# Patient Record
Sex: Female | Born: 1965 | Race: White | Hispanic: No | Marital: Married | Smoking: Never smoker
Health system: Southern US, Community
[De-identification: ages and names within clinical notes are randomized; demographics above are authoritative.]

## PROBLEM LIST (undated history)

## (undated) DIAGNOSIS — I341 Nonrheumatic mitral (valve) prolapse: Secondary | ICD-10-CM

## (undated) DIAGNOSIS — I493 Ventricular premature depolarization: Secondary | ICD-10-CM

## (undated) DIAGNOSIS — I472 Ventricular tachycardia: Secondary | ICD-10-CM

## (undated) DIAGNOSIS — I480 Paroxysmal atrial fibrillation: Secondary | ICD-10-CM

## (undated) DIAGNOSIS — I4729 Other ventricular tachycardia: Secondary | ICD-10-CM

## (undated) DIAGNOSIS — E039 Hypothyroidism, unspecified: Secondary | ICD-10-CM

## (undated) DIAGNOSIS — I491 Atrial premature depolarization: Secondary | ICD-10-CM

## (undated) DIAGNOSIS — I7300 Raynaud's syndrome without gangrene: Secondary | ICD-10-CM

## (undated) DIAGNOSIS — E785 Hyperlipidemia, unspecified: Secondary | ICD-10-CM

## (undated) DIAGNOSIS — Z90710 Acquired absence of both cervix and uterus: Secondary | ICD-10-CM

## (undated) DIAGNOSIS — I471 Supraventricular tachycardia: Secondary | ICD-10-CM

## (undated) DIAGNOSIS — I340 Nonrheumatic mitral (valve) insufficiency: Secondary | ICD-10-CM

## (undated) DIAGNOSIS — N393 Stress incontinence (female) (male): Secondary | ICD-10-CM

## (undated) DIAGNOSIS — I4719 Other supraventricular tachycardia: Secondary | ICD-10-CM

## (undated) HISTORY — DX: Hyperlipidemia, unspecified: E78.5

## (undated) HISTORY — DX: Stress incontinence (female) (male): N39.3

## (undated) HISTORY — DX: Paroxysmal atrial fibrillation: I48.0

## (undated) HISTORY — DX: Atrial premature depolarization: I49.1

## (undated) HISTORY — DX: Ventricular tachycardia: I47.2

## (undated) HISTORY — PX: BREAST ENHANCEMENT SURGERY: SHX7

## (undated) HISTORY — DX: Ventricular premature depolarization: I49.3

## (undated) HISTORY — DX: Nonrheumatic mitral (valve) prolapse: I34.1

## (undated) HISTORY — DX: Supraventricular tachycardia: I47.1

## (undated) HISTORY — DX: Other supraventricular tachycardia: I47.19

## (undated) HISTORY — DX: Nonrheumatic mitral (valve) insufficiency: I34.0

## (undated) HISTORY — DX: Other ventricular tachycardia: I47.29

## (undated) HISTORY — DX: Raynaud's syndrome without gangrene: I73.00

## (undated) HISTORY — PX: INCONTINENCE SURGERY: SHX676

## (undated) HISTORY — DX: Acquired absence of both cervix and uterus: Z90.710

## (undated) HISTORY — PX: APPENDECTOMY: SHX54

## (undated) HISTORY — DX: Hypothyroidism, unspecified: E03.9

## (undated) HISTORY — PX: ABDOMINAL EXPLORATION SURGERY: SHX538

---

## 2002-01-28 ENCOUNTER — Other Ambulatory Visit: Admission: RE | Admit: 2002-01-28 | Discharge: 2002-01-28 | Payer: Self-pay | Admitting: Obstetrics and Gynecology

## 2003-02-08 ENCOUNTER — Other Ambulatory Visit: Admission: RE | Admit: 2003-02-08 | Discharge: 2003-02-08 | Payer: Self-pay | Admitting: Obstetrics and Gynecology

## 2004-02-10 ENCOUNTER — Other Ambulatory Visit: Admission: RE | Admit: 2004-02-10 | Discharge: 2004-02-10 | Payer: Self-pay | Admitting: Obstetrics and Gynecology

## 2005-02-12 ENCOUNTER — Other Ambulatory Visit: Admission: RE | Admit: 2005-02-12 | Discharge: 2005-02-12 | Payer: Self-pay | Admitting: Obstetrics and Gynecology

## 2006-01-08 ENCOUNTER — Encounter (INDEPENDENT_AMBULATORY_CARE_PROVIDER_SITE_OTHER): Payer: Self-pay | Admitting: *Deleted

## 2006-01-08 ENCOUNTER — Ambulatory Visit: Payer: Self-pay | Admitting: Cardiovascular Disease

## 2006-01-09 ENCOUNTER — Inpatient Hospital Stay (HOSPITAL_COMMUNITY): Admission: RE | Admit: 2006-01-09 | Discharge: 2006-01-10 | Payer: Self-pay | Admitting: Obstetrics and Gynecology

## 2006-01-09 ENCOUNTER — Encounter: Payer: Self-pay | Admitting: Internal Medicine

## 2006-01-30 ENCOUNTER — Ambulatory Visit: Payer: Self-pay | Admitting: Cardiovascular Disease

## 2006-02-15 ENCOUNTER — Ambulatory Visit: Payer: Self-pay | Admitting: Internal Medicine

## 2006-03-07 ENCOUNTER — Ambulatory Visit: Payer: Self-pay | Admitting: Cardiovascular Disease

## 2006-03-07 ENCOUNTER — Ambulatory Visit (HOSPITAL_COMMUNITY): Admission: RE | Admit: 2006-03-07 | Discharge: 2006-03-07 | Payer: Self-pay | Admitting: Cardiovascular Disease

## 2006-03-11 ENCOUNTER — Ambulatory Visit: Payer: Self-pay | Admitting: Internal Medicine

## 2006-05-13 ENCOUNTER — Ambulatory Visit: Payer: Self-pay | Admitting: Internal Medicine

## 2007-01-29 ENCOUNTER — Ambulatory Visit: Payer: Self-pay | Admitting: Internal Medicine

## 2008-07-23 ENCOUNTER — Ambulatory Visit: Payer: Self-pay | Admitting: Internal Medicine

## 2008-12-28 ENCOUNTER — Telehealth: Payer: Self-pay | Admitting: Internal Medicine

## 2009-12-02 ENCOUNTER — Telehealth: Payer: Self-pay | Admitting: Internal Medicine

## 2010-08-01 ENCOUNTER — Encounter: Payer: Self-pay | Admitting: Internal Medicine

## 2010-08-01 ENCOUNTER — Ambulatory Visit: Payer: Self-pay | Admitting: Internal Medicine

## 2010-08-01 DIAGNOSIS — R002 Palpitations: Secondary | ICD-10-CM

## 2010-08-03 ENCOUNTER — Telehealth: Payer: Self-pay | Admitting: Internal Medicine

## 2010-09-19 NOTE — Progress Notes (Signed)
Summary: pt needs refill today  Phone Note Refill Request Message from:  Patient on [facility] in [place] [phone]  Refills Requested: Medication #1:  INDERAL LA 80 MG XR24H-CAP Initial call taken by: Omer Jack,  December 02, 2009 8:34 AM    New/Updated Medications: INDERAL LA 80 MG XR24H-CAP (PROPRANOLOL HCL) take one tablet daily Prescriptions: INDERAL LA 80 MG XR24H-CAP (PROPRANOLOL HCL) take one tablet daily  #30 x 6   Entered by:   Judithe Modest CMA   Authorized by:   Nathen May, MD, [facility]   Signed by:   Judithe Modest CMA on 12/02/2009   Method used:   Electronically to        [facility] (retail)       [address]       [place], [place]  [address]       Ph: [phone]       Fax: [phone]   RxID:   [id number]

## 2010-09-21 NOTE — Assessment & Plan Note (Signed)
Summary: 2year f/u /sl   Visit Type:  2 year follow up   History of Present Illness:  Mrs. Schwier comes in for followup for ventricular or atrial ectopy, now well controlled on her Inderal LA 80.  She has, however, not being able to exercise or chose not exercise because it is associated with palpitations.   Her biggest issue now is pain in her back  Problems Prior to Update: 1)  Hyperlipidemia  (ICD-272.4) 2)  Dm  (ICD-250.00)  Current Medications (verified): 1)  Inderal La 80 Mg Xr24h-Cap (Propranolol Hcl) .... Take One Tablet Daily 2)  Protonix 40 Mg Solr (Pantoprazole Sodium) .... Two Times A Day 3)  Estrace 1 Mg Tabs (Estradiol) .... Once Daily 4)  Levothroid 50 Mcg Tabs (Levothyroxine Sodium) .... Once Daily 5)  Dhea 15-50 Mg Caps (Nutritional Supplements) .... Once Daily 6)  First-Testosterone Mc 2 % Crea (Testosterone Propionate) .... Vagenal Cream, 1/4 Gm Once Daily 7)  Prometrium 200 Mg Caps (Progesterone Micronized) .... At Bedtime 8)  Melatonin 3 Mg Tabs (Melatonin) .... At Bedtime  Allergies (verified): No Known Drug Allergies  Past History:  Past Medical History: Last updated: 07/31/2010  She has no history of diabetes, hypertension,  hyperlipidemia, family history of coronary artery disease, or tobacco use.  She gets a regular medical check-up that includes a lipid profile on a  yearly basis.  She has a history of endometriosis.  She has a history of  stress incontinence.  Past Surgical History: Last updated: 07/31/2010  She had a total abdominal hysterectomy as well as a  bladder sling today.  She has a history of abdominal laparoscopic surgery as  well as appendectomy and breast augmentation.  Family History: Last updated: 07/31/2010  Her mother is alive at age 88 with a history of paroxysmal  atrial fibrillation, but no history of coronary artery disease.  Her father  is alive at age 67 with no history of heart disease and she has no heart  disease in any of her siblings.  Social History: Last updated: 07/31/2010 She lives in [place] with her husband and works in  Aeronautical engineer.  She has no history of alcohol, tobacco, or drug abuse.  Vital Signs:  Patient profile:   45 year old female Height:      69 inches Weight:      181.25 pounds BMI:     26.86 Pulse rate:   73 / minute BP sitting:   118 / 76  (left arm) Cuff size:   regular  Vitals Entered By: Caralee Ates CMA (August 01, 2010 2:51 PM)  Physical Exam  General:  The patient was alert and oriented in no acute distress. HEENT Normal.  Neck veins were flat, carotids were brisk.  Lungs were clear.  Heart sounds were regular without murmurs or gallops.  Abdomen was soft with active bowel sounds. There is no clubbing cyanosis or edema. Skin Warm and dry    EKG  Procedure date:  08/01/2010  Findings:      sinus rhythm at 62 Oh 0.07/2104.40 Axis of 45  Impression & Recommendations:  Problem # 1:  PALPITATIONS, OCCASIONAL (ICD-785.1) Her palpitations are now largely quiet. Will try and wean her off of her Inderal. We will have her current capsules in hal for one month and if without symptoms to stop it altogether. She'll let us know. The following medications were removed from the medication list:    Inderal La 80 Mg Xr24h-cap (Propranolol hcl) Her  updated medication list for this problem includes:    Inderal La 80 Mg Xr24h-cap (Propranolol hcl) .Marland Kitchen... Take one tablet daily  Appended Document: 2year f/u /sl    Clinical Lists Changes  Observations: Added new observation of PI CARDIO: Your physician recommends that you schedule a follow-up appointment in: as needed Your physician recommends that you continue on your current medications as directed. Please refer to the Current Medication list given to you today. (08/01/2010 15:26)       Patient Instructions: 1)  Your physician recommends that you schedule a follow-up appointment in: as  needed 2)  Your physician recommends that you continue on your current medications as directed. Please refer to the Current Medication list given to you today.

## 2010-09-21 NOTE — Progress Notes (Signed)
Summary: med questions  Phone Note Call from Patient   Caller: Patient [phone] Reason for Call: Talk to Nurse Summary of Call: pt has questions re  propanalol Initial call taken by: Glynda Jaeger,  August 03, 2010 3:08 PM  Follow-up for Phone Call        pt wanted to have 40mg  in 20mg  pills and take two times a day rather than separating capsules. Dr. Juanda Chance ok the change.  Follow-up by: Claris Gladden RN,  August 03, 2010 5:46 PM    New/Updated Medications: PROPRANOLOL HCL 20 MG TABS (PROPRANOLOL HCL) two times a day Prescriptions: PROPRANOLOL HCL 20 MG TABS (PROPRANOLOL HCL) two times a day  #180 x 3   Entered by:   Claris Gladden RN   Authorized by:   Nathen May, MD, [facility]   Signed by:   Claris Gladden RN on 08/03/2010   Method used:   Electronically to        [facility] (retail)       [address]       [place], [place]  [address]       Ph: [phone]       Fax: [phone]   RxID:   [id number]

## 2011-01-02 NOTE — Assessment & Plan Note (Signed)
[facility]                         ELECTROPHYSIOLOGY OFFICE NOTE   NAME:[name], Caroline BIGLER                     MRN:          [id number]  DATE:07/23/2008                            DOB:          1965-10-22    [name] comes in for followup for ventricular or atrial ectopy,  now well controlled on her Inderal LA 80.  She has, however, not being  able to exercise or chose not exercise because it is associated with  palpitations.   On examination, her weight is up about 13 pounds over the last 2 years  and is up 7 pounds over the last year and a half.  The lungs were clear.  The heart sounds were regular.  Extremities were without edema.   IMPRESSION:  1. Symptomatic atrial or ventricular ectopy.  2. Beta-blocker for number 1.   I have given her prescription for Inderal 20 to try to take in the  morning prior to exercise to see if the short-term effects does not  suffice to allow her to exercise.   We will see her again in 2 years' time.     Duke Salvia, MD, [facility]  Electronically Signed    SCK/MedQ  DD: 07/23/2008  DT: 07/24/2008  Job #: [id number]   cc:   Brooke Bonito

## 2011-01-02 NOTE — Assessment & Plan Note (Signed)
 HEALTHCARE                         ELECTROPHYSIOLOGY OFFICE NOTE   NAME:[name], Caroline GADEA                     MRN:          [id number]  DATE:01/29/2007                            DOB:          [birth year]    [name] comes in.  She had symptomatic atrial ventricular ectopy  which is now well controlled on Inderal LA 80.  She was initially  concerned about weight gain associated with this.  This concern has  persisted.  Her weight is up about 16 pounds over the last year and a  half but about 4 pounds only in the last nine months on the drug.  She  feels much better.  Occasionally at the end of the day, she will have  breakthrough, and she will take her evening dose a little bit early.   PHYSICAL EXAMINATION:  VITAL SIGNS:  Her blood pressure today was  110/78.  Her pulse was 65.  LUNGS:  Clear.  CARDIAC:  Heart sounds were regular.   IMPRESSION:  1. Atrial and ventricular ectopy.  2. Beta blocker therapy, question associated with weight gain.   Overall, [name] is doing well.  She feels comfortable on her  current medical regimen.  She would like to follow [place] up on a p.r.n.  basis.  She will follow up with Dr. Brynda Rim, and refills will be  prescribed by him.  I will be glad to be available when needed.     Duke Salvia, MD, [facility]  Electronically Signed    SCK/MedQ  DD: 01/29/2007  DT: 01/30/2007  Job #: [id number]   cc:   Brooke Bonito

## 2011-01-05 NOTE — Op Note (Signed)
[name], [name] NO.:  [id number]   MEDICAL RECORD NO.:  [id number]          PATIENT TYPE:  AMB   LOCATION:  DAY                          FACILITY:  [facility]   PHYSICIAN:  Huel Cote, M.D. DATE OF BIRTH:  1965/11/07   DATE OF PROCEDURE:  01/08/2006  DATE OF DISCHARGE:                                 OPERATIVE REPORT   PREOPERATIVE DIAGNOSIS:  1.  Menorrhagia.  2.  Dysmenorrhea.  3.  Probable endometriosis.  4.  Stress urinary incontinence.   POSTOPERATIVE DIAGNOSIS:  1.  Menorrhagia.  2.  Dysmenorrhea.  3.  Probable endometriosis.  4.  Stress urinary incontinence.  5.  Foci of endometriosis noted throughout pelvis.   PROCEDURE:  Attempted laparoscopic assisted vaginal hysterectomy, total  abdominal hysterectomy, anterior posterior repair, and a sling, and  cystoscopy by Dr. Sherron Monday.   SURGEON:  Huel Cote, M.D.   ASSISTANT:  Zenaida Niece, M.D.  Martina Sinner, M.D.   ANESTHESIA:  General.   SPECIMENS:  Uterus and cervix were sent.   ESTIMATED BLOOD LOSS:  450 mL.   URINE OUTPUT:  100 mL clear urine.   IV FLUIDS:  2400 mL.   COMPLICATIONS:  There was some bleeding from the left infundibulopelvic  ligament which could not be well controlled through the scope despite  multiple efforts. Therefore, the patient was opened and an open approach was  utilized to control bleeding.   FINDINGS:  The ovaries and tubes were normal.  There were several foci of  endometriosis noted throughout the pelvis especially on the left lower  sidewall which was treated with cauterization.   DESCRIPTION OF PROCEDURE:  The patient was taken to the operating room where  general anesthesia was obtained without difficulty.  She was then prepped  and draped in a normal sterile fashion in the dorsal lithotomy position.  Attention was then turned to the patient's abdomen.  An infraumbilical  incision was made with the scalpel and carried through to  the underlying  layer with the scalpel.  This was then elevated and the Veress needle easily  introduced into the abdominal cavity.  Pneumoperitoneum was then obtained  with approximately 3 liters CO2 gas.  The Veress needle was then removed and  the 10/11 was trocar was introduced under direct visualization with the  camera and entered the peritoneal cavity easily.  At this point, the abdomen  and pelvis were inspected.  It was noted that the bowel was quite redundant  and was difficult to be pulled up out of the pelvis. Two additional port  sites were then placed with 5 mm trocars in each lower quadrant under direct  visualization. Each trocar site was injected with 0.25% Marcaine prior to  placement. At this point, the uterus was found to be quite retroverted and  had good mobility. It was elevated with a grasper on the left cornua. The  ovaries and tubes were inspected and found to be normal.  Therefore, the  decision to leave those in situ was made.  A Harmonic scalpel was then  utilized to transect the utero-ovarian ligament  as well as the fallopian  tube on the left. This was taken down through the remainder of the broad  ligament at the lower portion of the uterus.  In attempting to take down  more broad ligament, there was bleeding encountered both on the ovarian side  and on the uterine side. Attempts were made with both Harmonic scalpel and  the Kleppinger cautery to obtain hemostasis.  These were attempted over the  course of approximately 15-20 minutes without adequate hemostasis being  noted.  There was also some difficulty in removing enough of the hemolyzed  clot and blood from the pelvis to gain adequate visualization of the area of  bleeding. At this point, the decision was made to make a laparotomy and  proceed open given that there was probably a 300-400 mL blood loss and the  bleeding could not be completely controlled.  Therefore, all instruments and  ports were  removed from the abdomen.   A skin incision was then made with scalpel and carried through to the  underlying layer of fascia by sharp dissection and Bovie cautery.  The  fascia was then opened in the midline and extended laterally with Mayo  scissors.  The superior aspect was grasped with Kocher clamps, elevated, and  dissected off the rectus muscles.  The inferior aspect was grasped with  Kocher clamps and dissected off the rectus muscles.  The rectus muscles were  separated in the midline and the peritoneal cavity entered bluntly.  The  peritoneal incision was extended superiorly and inferiorly with careful  attention to avoid both bowel and bladder.  The uterus, itself, was then  grasped with a Kelly clamp in each cornu and elevated.  The bowel was  retracted with a moist lap sponge.  A large self-retaining Alexis wound  retractor was placed within the incision and good visualization noted.  The  area of bleeding was easily then controlled with a suture ligature on the  ovarian side.  It appeared that there was a small portion of left  infundibulopelvic bleeding just adjacent to the ovary, however, this was  easily controlled with one suture ligature.  The uterus bleeding was  controlled with a Zeppelin clamp and Mayo scissors were used to transect  this area and then a second suture ligature was placed with good hemostasis  noted at this point.   Attention was then turned to the patient's right where the round ligament  was opened with Bovie cautery and the utero-ovarian ligament isolated and  clamped with a Zeppelin clamp.  This was then completely transected with  Mayo scissors and suture ligated with 0 Vicryl x2.  The remaining broad  ligament was then taken down to the level of the uterine arteries by  skeletonization and the uterine arteries were clamped bilaterally, transected and suture ligated with 0 Vicryl.  The bladder flap was carefully  advanced and taken down with Bovie  cautery.  Once this was well out of the  visual field, the remainder of the paracervical tissue was taken down with  Zeppelin clamps and the uterosacral ligaments transected and suture ligated  with 0 Vicryl and held in a hemostat. With the vaginal cuff thus isolated,  the vaginal cuff itself was crossclamped with Zeppelin clamps and the cervix  and uterus then amputated in its entirety.  The vaginal cuff was then closed  in each angle with 0 Vicryl and two additional figure-of-eight suture  ligatures were used in the midline of the cuff  with excellent hemostasis  noted.   The abdomen and pelvis were then copiously irrigated and all clots and  bleeding removed.  The right ovarian pedicle was noted to be bleeding  slightly and two additional suture ligatures were placed in that portion  with good hemostasis noted. The ureters were palpated and felt to be normal  in caliber and well away from the surgical field.  The uterosacral ligaments  were then approximated with 0 Vicryl suture.  Once again, all areas were  carefully inspected and found to be hemostatic.  Therefore, all instruments  and sponges were removed from the patient's abdomen.  Her fascia was then  closed with 0 Vicryl in a running fashion.  The skin was closed with  staples.  Each trocar site was also closed with 3-0 Vicryl in a subcuticular  stitch of the skin.   The attention was then turned to the patient's vagina where a weighted  speculum was placed and the vaginal cuff grasped with Allis clamps.  A small  amount of vaginal mucosa was then trimmed away with Mayo scissors and the  vaginal mucosa underscored along the midline up to the level of the urethra.  This was then retracted laterally and the pubovesical fascia was dissected  off the mucosal flaps and retracted to the midline.  This dissection was  performed well and at this point, Dr. Sherron Monday was called in and perform  the [facility] sling procedure. The remaining  anterior repair was then performed  with several interrupted sutures of 0 Vicryl placed to reapproximate the  pubovesical fascia.  Once this was completed, the excess mucosa was trimmed  away with Mayo scissors and the vaginal mucosa closed with 2-0 Vicryl in a  running fashion. Attention was then turned to the posterior vagina which was  noted to have a small rectocele.  This was grasped with Allis clamps just  inside the introitus and a small amount of vaginal mucosa trimmed away with  Mayo scissors.  The vaginal mucosa was then underscored along the midline  and the vaginal mucosa retracted laterally with the rectovaginal fascia then  dissected off the mucosal flaps.  This was retracted medially.  The  rectovaginal fascia was then reapproximated in several interrupted sutures  of 0 Vicryl. Once the rectocele had been reduced, the excess mucosa was trimmed away and the vaginal mucosa closed with 2-0 Vicryl in a running  locked fashion. At the conclusion, there was no excessive bleeding noted.  The vagina was packed with an Estrace coated gauze and all instruments and  sponge counts were again correct x2.  Therefore, the patient was awakened  and taken to the recovery room in stable condition.      Huel Cote, M.D.  Electronically Signed     KR/MEDQ  D:  01/08/2006  T:  01/08/2006  Job:  [id number]

## 2011-01-05 NOTE — Assessment & Plan Note (Signed)
[facility]                           ELECTROPHYSIOLOGY OFFICE NOTE   NAME:[name], [name]                     MRN:          [id number]  DATE:03/11/2006                            DOB:          30-Dec-1965    HISTORY:  [name] was admitted for treadmill testing because of  ventricular ectopy.  She recently underwent MRI scanning because of  complexities of ventricular ectopy with multiple morphologies evident on  event recorder.   She was submitted to standard Bruce protocol.  It was terminated after about  8 minutes when her blood pressure went from 161 to 131.  On initial  recovery, blood pressure was 141 and increased again to 155.  The patient  had no untoward symptoms with this.   However, in recovery, the patient began to develop complex ventricular  ectopy including nonsustained ventricular tachycardia with two different  morphology beats.  The predominate morphology of her beats was a right  bundle superior axis with a transition in lead V3.  She would have short  runs of nonsustained ventricular tachycardia, the longest of which lasted  about 15 seconds.  These were associated with some sensation of shortness of  breath.  As previously noted, there were multiple morphologies on the event  recorder.  As we look at the 12 lead, most all of her ectopy, but not all of  it, has a right bundle branch block appearance.  Interestingly, the  transition for some beats is in V2 and the others are positive all the way  to V6.  The dominant one, however, has the earlier transition.  As noted,  she also had nonsustained ventricular tachycardia with now inclusive left  bundle branch block beats.   The other thing that was of note was that she entered the lab in tachycardia  which is now clearly an atrial tachycardia.  The P-wave morphology was  negative in lead V1, negative in lead L, and upright in lead 1.  T-wave  inversions were evident  during the tachycardia in the inferior leads which  were not evident in her regular rhythm.   IMPRESSION:  1.  _________atrial tachycardia.  2.  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   Based on the fact that Mrs. Teas's MRI was normal, that there is no  evidence of structural heart disease, my thoughts are that we should  undertake a suppressive regime for her ventricular and atrial arrhythmia,  the latter to prevent development of tachycardia-induced cardiomyopathy.  We  have given her prescriptions today for Inderal LA 80, Nadolol 40, atenolol  50 to take in random order to see if any of these is tolerated and  effective.  They are to be taken in lieu of her verapamil.  Will sit down  together in six to eight weeks' time to review how it is that she is  doing.  In the interim, I would like to plan to review her case with other doctors  to see  what other recommendations we come forward with.  Initially, my thought  would be to try a 1 C antiarrhythmic give the diffuse nature of her  arrhythmias.                                   Duke Salvia, MD, [facility]   SCK/MedQ  DD:  03/11/2006  DT:  03/11/2006  Job #:  [id number]

## 2011-01-05 NOTE — Consult Note (Signed)
[name], [name] NO.:  [id number]   MEDICAL RECORD NO.:  [id number]          PATIENT TYPE:  OIB   LOCATION:  0098                         FACILITY:  [facility]   PHYSICIAN:  Charlton Haws, M.D.     DATE OF BIRTH:  1965/10/20   DATE OF CONSULTATION:  01/08/2006  DATE OF DISCHARGE:                                   CONSULTATION   PRIMARY CARE PHYSICIAN:  Dr. Brooke Bonito   PRIMARY CARDIOLOGIST:  New and will be Dr. Eden Emms   CHIEF COMPLAINT:  Palpitations.   HISTORY OF PRESENT ILLNESS:  [name] is a 45 year old white female with  a history of an irregular heart rate.  She was admitted for a total  abdominal hysterectomy and a sling.  Postoperatively her heart rate was  noted to be irregular and cardiac consult was called.   [name] has a long history of palpitations.  She was first evaluated in  [place] in 1994 and then in [place] at [facility] in 2001.  At that time she had an event monitor and a treadmill test which was  probably a nuclear stress test.  At that time she was told she was okay and  was on a beta blocker for a while which was Toprol.  She had fatigue with  this and it was discontinued.  She was told I didn't really need it.  She  feels palpitations on a regular basis.  She denies any presyncope or  syncopal symptoms.  She denies dizziness.  She gets occasional sharp chest  pain and indigestion symptoms which are relieved with Rolaids and milk but  these are never associated with her palpitations.  The palpitations do not  cause chest pain.  They do not cause shortness of breath, diaphoresis,  nausea, or vomiting.  She does not associate then with any particular  activity, but does mention that when she was on the treadmill for her stress  test she did not have any at all during the exercise portion but they  started again when she stopped.   PAST MEDICAL HISTORY:  She has no history of diabetes, hypertension,  hyperlipidemia, family history of coronary artery disease, or tobacco use.  She gets a regular medical check-up that includes a lipid profile on a  yearly basis.  She has a history of endometriosis.  She has a history of  stress incontinence.   SURGICAL HISTORY:  She had a total abdominal hysterectomy as well as a  bladder sling today.  She has a history of abdominal laparoscopic surgery as  well as appendectomy and breast augmentation.   ALLERGIES:  She has no known drug allergies, but does think she has fatigue  secondary to Toprol.   MEDICATIONS:  She takes p.r.n. pain medication as well as multivitamin,  calcium daily.  She takes Prilosec OTC one tablet daily as well as p.r.n.  Rolaids.   SOCIAL HISTORY:  She lives in [place] with her husband and works in  Aeronautical engineer.  She has no history of alcohol, tobacco, or drug abuse.   FAMILY HISTORY:  Her mother is alive at age 45 with a history of paroxysmal  atrial fibrillation, but no history of coronary artery disease.  Her father  is alive at age 98 with no history of heart disease and she has no heart  disease in any of her siblings.   REVIEW OF SYSTEMS:  She had one episode of melena a couple of weeks ago.  Reflux symptoms on almost a daily basis which have improved on the Prilosec,  but have not resolved.  She has some dyspnea on exertion which is chronic  and has not changed recently.  She has occasional low back pain.  She denies  any recent fevers or chills.  She has urinary incontinence.  Review of  systems is otherwise negative.   PHYSICAL EXAMINATION:  VITAL SIGNS:  She is afebrile.  Blood pressure 117/59  with a heart rate of 99, respiratory rate 18, O2 saturation 100% on 2 L.  GENERAL:  She is a well-developed, well-nourished white female in no acute  distress.  HEENT:  Head is normocephalic and atraumatic with pupils equal, round, react  to light and accommodation.  Extraocular movements intact.  Sclerae  clear.  Nares without discharge.  NECK:  There is no lymphadenopathy, thyromegaly, bruit, or JVD noted.  CV:  Her  heart is regular in rate and rhythm with an S1, S2, and S4 but no  significant murmur or rub is noted.  CHEST:  Clear to auscultation bilaterally.  ABDOMEN:  Soft and there is a dressed incision in the midline.  She has  slightly decreased bowel sounds and her abdomen was not palpated.  EXTREMITIES:  There is no cyanosis, clubbing, or edema noted.  Distal pulses  are 2+ in all four extremities and no femoral bruits are appreciated.  NEURO:  She is alert and oriented.  Cranial nerves II-XII grossly intact.  Because of the surgery today she has slight hoarseness.   EKG is sinus rhythm was frequent multifocal PVCs which occasionally have an  R on T appearance.  Her QT is 376 with a QTC of 496.  P to R is 126.  QRS  duration is 94 milliseconds.   LABORATORY VALUES:  Sodium 137, potassium 3.7, chloride 108, CO2 26, BUN 11,  creatinine 0.6, glucose 128, calcium 8.3, magnesium 1.4.  Hemoglobin 13.6,  hematocrit 40.9, WBC 7.7, platelets 256.  INR 1.   [name] has a long history of tachy palpitations.  She has been  asymptomatic with this but with the amount and type of her arrhythmia  cardiology was contacted.  She has right bundle block morphology with  negative deflection in leads 2, 3, and aVF.  Her examination is benign.  We  will check an echocardiogram.  She possibly has a calcium dependent left  ventricular focus.  Will try low-dose Cardizem q.6h. and titrate this if her  symptoms improve and if her blood pressure will tolerate.  Will not use a  beta blocker secondary to a previous fatigue with this medication.  We will  follow her while she is in the hospital and consideration will be given to  electrophysiology follow-up.  Will monitor her potassium and magnesium level and keep the potassium level 4 or greater and the magnesium level 2 or  greater as well.       Theodore Demark, P.A. [facility]    ______________________________  Charlton Haws, M.D.    RB/MEDQ  D:  01/08/2006  T:  01/09/2006  Job:  [id number]

## 2011-01-05 NOTE — H&P (Signed)
NAMESHEMEKA, WARDLE NO.:  [id number]   MEDICAL RECORD NO.:  [id number]          PATIENT TYPE:  AMB   LOCATION:  DAY                          FACILITY:  [facility]   PHYSICIAN:  Huel Cote, M.D. DATE OF BIRTH:  [birth year]   DATE OF ADMISSION:  DATE OF DISCHARGE:                                HISTORY & PHYSICAL   DATE OF SURGERY:  Surgery to take place Jan 08, 2006 at 10:30 A.M. at [facility].   HISTORY OF PRESENT ILLNESS:  The patient is a 45 year old G1, P1 who is  coming in for a scheduled laparoscopically assisted vaginal hysterectomy,  possible bilateral salpingo-oophorectomy, anterior, posterior repair and a  sling placement for stress urinary incontinence.  The patient has had a long  history of dysmenorrhea and has required ongoing medical treatment through  the years with birth control pills and intermittent narcotic control of her  pain.  The patient is done with childbearing and has determined that she  would like definitive treatment for her pelvic pain.  She does have a  history of endometriosis which had been cauterized in the past and is felt  to be most likely responsible for her ongoing problems.   PAST MEDICAL HISTORY:  Her past medical history is significant for mitral  valve prolapse.   PAST SURGICAL HISTORY:  She had laparoscopy in 1994, cauterization of  endometriosis. She has also had an appendectomy and breast augmentation in  2002.   OBSTETRICAL HISTORY:  Significant for one vaginal delivery.  Her past GYN  history she has had some abnormal Pap smears with probably history of  cryotherapy in the past.  Her most recent Pap smear in 2006 was normal.   FAMILY HISTORY:  She has a family history with no breast cancer and one  heart attack in grandfather.   CURRENT MEDICATIONS:  Vicoprofen or Darvocet as needed for pain.   ALLERGIES:  She has no known drug allergies.   PHYSICAL EXAMINATION:  VITAL SIGNS:  The  patient's weight is 161 pounds.  Blood pressure 120/60.  CARDIAC:  Regular rate and rhythm.  LUNGS:  Clear.  ABDOMEN:  Soft, nontender.  PELVIC:  The patient's pelvic examination shows fair descensus of her  cervix. She has a normal sized uterus and adnexa have no masses. She has a  mild to moderate cystocele which is mostly noticeable with Valsalva  maneuver.  She has a small rectocele.  At the time of planning the surgery  the patient also reported significant stress urinary incontinence reporting  leaking with even fast walking, coughing and sneezing.  She requested and  received evaluation by urology for this with Dr. Alfredo Martinez, and also  plans to have a sling placed at the time of surgery.   The patient was counseled as to the risks and benefits of proceeding with  hysterectomy including bleeding, infection, possible damage to bowel and  bladder.  She understands and accepts these risks knowing that she may  require an abdominal incision should any of these risks become a reality.  We will proceed with a  laparoscopically assisted vaginal hysterectomy and  Dr. Sherron Monday will report intraoperatively to perform his sling procedure.  The patient was counseled separately for this procedure by Dr. Sherron Monday.  The patient was also counseled as to the possible removal of her ovaries and  in agreement we decided unless there was horrible pathology, regarding  endometriosis, that it would be best to leave her ovaries in place given her  young age and patient agrees with this plan.      Huel Cote, M.D.  Electronically Signed     KR/MEDQ  D:  01/07/2006  T:  01/07/2006  Job:  [id number]

## 2011-01-05 NOTE — Op Note (Signed)
NAMECAMRON, [name] NO.:  [id number]   MEDICAL RECORD NO.:  [id number]          PATIENT TYPE:  AMB   LOCATION:  DAY                          FACILITY:  [facility]   PHYSICIAN:  Martina Sinner, MD DATE OF BIRTH:  1965/11/04   DATE OF PROCEDURE:  01/08/2006  DATE OF DISCHARGE:                                 OPERATIVE REPORT   SURGEON:  Martina Sinner, M.D.   ASSISTANT:  Huel Cote, M.D.   PREOPERATIVE DIAGNOSIS:  Stress urinary incontinence.   POSTOPERATIVE DIAGNOSIS:  Stress urinary incontinence.   SURGERY:  Sling, cystourethropexy [facility]), cystoscopy.   [name] has stress incontinence.  She underwent a hysterectomy.  Dr.  Senaida Ores called me after performing a laparoscopic assisted and finally a  transabdominal hysterectomy.  Dr. Senaida Ores had opened up the anterior  vaginal wall and was partially finished an anterior repair.  The anterior  vaginal wall overlying the urethra was open and the pubovesical flap was  thick.  I could palpate the pubic symphysis bilaterally at the  urethrovesical angle.  I made two 1 cm incisions, 1.5 cm lateral to the  midline, approximately one fingerbreadth above the pubic bone.  With the  bladder empty, I passed the [facility] trocars to the retropubic space along the  back of the pubic bone onto the pulp of my index finger bilaterally.   I then cystoscoped the patient.  There was efflux of indigo carmine from  both ureteral orifices.  The bladder was not entered.  There was no trocar  with the bladder or urethra.   With the bladder empty, the sling was attached and brought up to the  retropubic space.  I tensioned using my usual technique.  I cut below the  blue dots and irrigated each sheath.  I then removed each sheath.  The sling  was in the mid urethra and was again tensioned with my usual techniques.  I  could hypermobilize and descend the sling in the midline as well as to the  left and right of the  midline.  Hemostasis was excellent.  At this point in  time, I closed the two abdominal wall incisions with one subcuticular 4-0  Monocryl.  Dermabond dressing will be applied at the end of the case.  Dr.  Senaida Ores continued to finish the anterior repair and will close the  anterior vaginal wall.   Hopefully, [name] will greatly benefit from the sling urethropexy.           ______________________________  Martina Sinner, MD  Electronically Signed     SAM/MEDQ  D:  01/08/2006  T:  01/08/2006  Job:  [id number]

## 2011-01-05 NOTE — Assessment & Plan Note (Signed)
[facility]                           ELECTROPHYSIOLOGY OFFICE NOTE   NAME:Draughon, HASSIE MANDT                     MRN:          [id number]  DATE:05/13/2006                            DOB:          1966-06-07    Ms. Dadisman is seen in followup for a complex atrial and ventricular  ectopy, which has largely subsided under the suppression of Inderal LA 80.  She had tried atenolol and nadolol with no relief.  She is tolerating the  Inderal quite well.  She notes that her clothes are a little heavier and she  is, on evaluation today, 10 pounds heavier.  She wonders whether this is  related to the drug.   Further medications include Protonix and calcium.  She does have a history  of GE reflux disease and has had some recurrent chest discomfort, which is  consistent with her reflux.   EXAMINATION:  __________  .  Her blood pressure is 119/75.  Her pulse is 60.  LUNGS:  Clear.  Heart sounds were regular.   Electrocardiogram dated today demonstrated unusual access.  I think the  leads were put on inappropriately.   IMPRESSION:  1. Ventricular ectopy.  2. Atrial ectopy.  3. Atypical chest pain consistent with gastroesophageal reflux disease.  4. Weight gain, questionably related to her Inderal.   Mrs. Tullis is doing really quite well.  I am pleased with the present  effects of the Inderal.  I am not quite sure how the Inderal may be  contributing to her weight, as she is not feeling lethargic and she is still  quite active.  She is picking up her activity with exercise and attending to  her diet a little bit more carefully.  We will see how that goes.   We will see her again in 9 months' time.   At that time, we may consider decreasing the Inderal LA to 60 mg a day.                                   Duke Salvia, MD, [facility]   SCK/MedQ  DD:  05/13/2006  DT:  05/14/2006  Job #:  [id number]   cc:   Brooke Bonito

## 2011-08-07 ENCOUNTER — Other Ambulatory Visit: Payer: Self-pay | Admitting: Internal Medicine

## 2011-09-06 ENCOUNTER — Encounter: Payer: Self-pay | Admitting: *Deleted

## 2011-09-10 ENCOUNTER — Telehealth: Payer: Self-pay | Admitting: *Deleted

## 2011-09-10 NOTE — Telephone Encounter (Signed)
error 

## 2011-09-12 ENCOUNTER — Encounter: Payer: Self-pay | Admitting: Internal Medicine

## 2011-09-12 ENCOUNTER — Ambulatory Visit (INDEPENDENT_AMBULATORY_CARE_PROVIDER_SITE_OTHER): Payer: BC Managed Care – PPO | Admitting: Internal Medicine

## 2011-09-12 VITALS — BP 120/80 | HR 71 | Ht 69.0 in | Wt 162.0 lb

## 2011-09-12 DIAGNOSIS — R002 Palpitations: Secondary | ICD-10-CM

## 2011-09-12 NOTE — Progress Notes (Signed)
  HPI  [name] is a 46 y.o. female comes in for followup for ventricular or atrial ectopy,  now well controlled on her Inderal LA 80.  She is currently taking Inderal 20 mg twice daily and doing really pretty well except for occasional flurries and    Past Medical History  Diagnosis Date  . Hyperlipidemia   . DM (diabetes mellitus)   . Endometriosis   . H/O: hysterectomy   . Stress incontinence, female   . Atrial ectopy     Past Surgical History  Procedure Date  . Incontinence surgery   . Breast enhancement surgery   . Abdominal exploration surgery   . Appendectomy     Current Outpatient Prescriptions  Medication Sig Dispense Refill  . estradiol (ESTRACE) 1 MG tablet Take 1 mg by mouth daily.      Marland Kitchen levothyroxine (SYNTHROID, LEVOTHROID) 50 MCG tablet Take 50 mcg by mouth daily.      . Melatonin 3 MG CAPS Take by mouth.      . Nutritional Supplements (DHEA) 15-50 MG CAPS Take by mouth.      . pantoprazole (PROTONIX) 40 MG tablet Take 40 mg by mouth daily.      . progesterone (PROMETRIUM) 200 MG capsule Take 200 mg by mouth daily.      . propranolol (INDERAL) 20 MG tablet TAKE ONE TABLET BY MOUTH TWICE DAILY  180 tablet  2    No Known Allergies  Review of Systems negative except from HPI and PMH  Physical Exam BP 120/80  Pulse 71  Ht 5\' 9"  (1.753 m)  Wt 162 lb (73.483 kg)  BMI 23.92 kg/m2 Well developed and well nourished in no acute distress HENT normal E scleral and icterus clear Neck Supple JVP flat; carotids brisk and full Clear to ausculation egular rate and rhythm, no murmurs gallops or rub Soft with active bowel sounds No clubbing cyanosis none Edema Alert and oriented, grossly normal motor and sensory function Skin Warm and Dry  ECG demonstrated normal sinus rhythm at 71 with intervals of 0.13/0.09/0.40 Axis LXXVII Assessment and  Plan

## 2011-09-12 NOTE — Assessment & Plan Note (Signed)
Relatively stable. We'll begin to have her take her Inderal as needed. We will see her again as needed

## 2011-09-13 ENCOUNTER — Telehealth: Payer: Self-pay | Admitting: Internal Medicine

## 2011-09-13 NOTE — Telephone Encounter (Signed)
New problem She was here yesterday and wanted to talk to you about med she was told to stop.

## 2011-09-13 NOTE — Telephone Encounter (Signed)
I spoke with the patient. She states that Dr. Graciela Husbands told her yesterday that she could use propranolol as needed. She did not take any last night or this morning and today her heart is pounding and she does not feel well. I advised her to go back on propranolol once daily for a week- 2 weeks depending on how she feels, then try every other day for about a week, then stop and take PRN if she can tolerate that. She verbalizes understanding and is agreeable. She will call us back if she cannot tolerate being off the propanolol.

## 2012-07-25 ENCOUNTER — Telehealth: Payer: Self-pay | Admitting: Internal Medicine

## 2012-07-25 MED ORDER — PROPRANOLOL HCL 20 MG PO TABS: 20.0000 mg | ORAL_TABLET | Freq: Two times a day (BID) | ORAL | Status: DC

## 2012-07-25 NOTE — Telephone Encounter (Signed)
Pt wants to know is it time for her to come in because she is on the meds that was given at last visit and dose she needs to be checked again or is she ok

## 2012-07-25 NOTE — Telephone Encounter (Signed)
Pt called for refill on Propanolol. States it is working well.  Her last ov 09/12/11 stated pt could be seen as needed. Pt only needed refill and did not need to schedule appointment. Mylo Red RN

## 2013-08-27 ENCOUNTER — Other Ambulatory Visit: Payer: Self-pay | Admitting: *Deleted

## 2013-08-27 MED ORDER — PROPRANOLOL HCL 20 MG PO TABS: 20.0000 mg | ORAL_TABLET | ORAL | Status: DC | PRN

## 2013-10-05 ENCOUNTER — Telehealth: Payer: Self-pay | Admitting: Internal Medicine

## 2013-10-05 NOTE — Telephone Encounter (Signed)
Pt is living in [place] for next several months, coming back and forth now and then for Dr appointments, etc.  Has had non healing toe infection for past month.  Treated with antibiotics, still infected. She has seen vascular surgeon, and has scheduled  LE dopplers and ABI's  Has echo scheduled this Friday 2/20 --the indication is "h/o arrythmia, now with embolic phenomena-toe" Pt asking if she can just have echo when she has her next appointment with Dr. Graciela HusbandsKlein. Instructed her that I would send message to Dr. Graciela HusbandsKlein but that for now I think she should keep the echo appointment.  Her reason for wanting to wait is having additional copayment.

## 2013-10-05 NOTE — Telephone Encounter (Signed)
New Message  Pt called states she was seen for an infected toe while in [place] she was scheduled for an ECHO in [place]// she wanted to know if she could have an ECHO scheduled for the same day as her appointment in march.. please advise.

## 2013-10-09 NOTE — Telephone Encounter (Signed)
Tried to reach pt several times to answer question about echo. Closing encounter as her echo is scheduled for today.

## 2013-10-22 ENCOUNTER — Ambulatory Visit (INDEPENDENT_AMBULATORY_CARE_PROVIDER_SITE_OTHER): Payer: BC Managed Care – PPO | Admitting: Internal Medicine

## 2013-10-22 ENCOUNTER — Encounter: Payer: Self-pay | Admitting: Internal Medicine

## 2013-10-22 VITALS — BP 128/88 | HR 75 | Ht 69.0 in | Wt 154.0 lb

## 2013-10-22 DIAGNOSIS — I491 Atrial premature depolarization: Secondary | ICD-10-CM

## 2013-10-22 NOTE — Progress Notes (Signed)
      Patient has no care team.   HPI  Caroline PleasureLaura F Zech is a 48 y.o. female comes in for followup for ventricular or atrial ectopy,  now well controlled on her Inderal LA 80.  She is currently taking Inderal 20 mg twice daily and doing really pretty well except for occasional flurries    She has developed nonhealing sores on her toes. She is currently under evaluation for vascular issues. She does have a history of Raynaud's   Past Medical History  Diagnosis Date  . Hyperlipidemia   . DM (diabetes mellitus)   . Endometriosis   . H/O: hysterectomy   . Stress incontinence, female   . Atrial ectopy     Past Surgical History  Procedure Laterality Date  . Incontinence surgery    . Breast enhancement surgery    . Abdominal exploration surgery    . Appendectomy      Current Outpatient Prescriptions  Medication Sig Dispense Refill  . CYCLOBENZAPRINE HCL PO Take 2 tablets by mouth as needed (Only temporary).      Marland Kitchen. estradiol (ESTRACE) 1 MG tablet Take 1 mg by mouth daily.      Marland Kitchen. etodolac (LODINE) 400 MG tablet Take 400 mg by mouth 2 (two) times daily.      Marland Kitchen. gabapentin (NEURONTIN) 100 MG capsule Take 100 mg by mouth 3 (three) times daily.      Marland Kitchen. levothyroxine (SYNTHROID, LEVOTHROID) 50 MCG tablet Take 50 mcg by mouth daily.      . Melatonin 5 MG CAPS Take 1 capsule by mouth at bedtime.      . Nutritional Supplements (DHEA) 15-50 MG CAPS Take 1 capsule by mouth.       . progesterone (PROMETRIUM) 200 MG capsule Take 200 mg by mouth daily.      . propranolol (INDERAL) 20 MG tablet Take 20 mg by mouth 2 (two) times daily.      . ranitidine (ZANTAC) 150 MG tablet Take 300 mg by mouth daily.       No current facility-administered medications for this visit.    Allergies  Allergen Reactions  . Other Anaphylaxis    Steroid (unknown) that is used for back injections     Review of Systems negative except from HPI and PMH  Physical Exam BP 128/88  Pulse 75  Ht 5\' 9"  (1.753  m)  Wt 154 lb (69.854 kg)  BMI 22.73 kg/m2 Well developed and well nourished in no acute distress HENT normal E scleral and icterus clear Neck Supple JVP flat; carotids brisk and full Clear to ausculation  Regular rate and rhythm, no murmurs gallops or rub Soft with active bowel sounds No clubbing cyanosis none Edema Alert and oriented, grossly normal motor and sensory function Skin Warm and Dry; there are nonhealing sores on toes on both feet.  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 She is on propranolol. When she tried to stop it she had recurrent symptoms. However, given her Raynaud's, we'll stop her beta blocker. I will get her prescription for diltiazem 120. In the event that this doesn't work I have also given her a prescription for carvedilol 3.125 which is both alpha and beta blocker. I've encouraged her to followup with a dermatologist in the area to think about cutaneous manifestations of systemic disease.

## 2013-10-28 ENCOUNTER — Telehealth: Payer: Self-pay | Admitting: *Deleted

## 2013-10-28 NOTE — Telephone Encounter (Signed)
lmtcb regarding follow up.

## 2013-11-10 NOTE — Telephone Encounter (Signed)
lmtcb  (need to know if she is going to follow up here w/ Graciela HusbandsKlein or in [place])

## 2014-01-13 ENCOUNTER — Telehealth: Payer: Self-pay | Admitting: Internal Medicine

## 2014-01-13 NOTE — Telephone Encounter (Signed)
New message     Pt was taking carvedilol 3.125mg  bid.  She has been in [place] since November.  The doctor in [place] put her on a heart monitor and it showed VTach and SVT.  She doubled her dosage to 2 pills bid.  She is almost out and need a refill.  She is coming home tomorrow.  If OK send refill to [facility] at [address].  Call pt and let her know if dr call in medication

## 2014-01-13 NOTE — Telephone Encounter (Signed)
Spoke with patient earlier whom was out breath just talking with me. She states that 7 days after stopping propranolol she was in the E.D.  She was started on Amlodipine 2.5 mg daily while in [place]. The question is whether to restart propanolol or discuss other options. Will schedule pt to see Dr. Graciela Husbands Tuesday 6/2 before she returns to [facility] on Wednesday. She is agreeable to plan. Will also send in rx for 30 day supply of Carvedilol.

## 2014-01-14 ENCOUNTER — Other Ambulatory Visit: Payer: Self-pay | Admitting: *Deleted

## 2014-01-14 MED ORDER — CARVEDILOL 3.125 MG PO TABS: 3.1250 mg | ORAL_TABLET | Freq: Two times a day (BID) | ORAL | Status: DC

## 2014-01-14 NOTE — Telephone Encounter (Signed)
Pt is going to have doctor in [place] send office visit and monitor results to [place].   (Office is: [facility], Dr. Lance Muss, [phone])

## 2014-01-15 ENCOUNTER — Other Ambulatory Visit: Payer: Self-pay

## 2014-01-15 MED ORDER — CARVEDILOL 6.25 MG PO TABS: 6.2500 mg | ORAL_TABLET | Freq: Two times a day (BID) | ORAL | Status: DC

## 2014-01-19 ENCOUNTER — Ambulatory Visit (INDEPENDENT_AMBULATORY_CARE_PROVIDER_SITE_OTHER): Payer: BC Managed Care – PPO | Admitting: Internal Medicine

## 2014-01-19 VITALS — BP 131/87 | HR 87 | Ht 69.0 in | Wt 156.0 lb

## 2014-01-19 DIAGNOSIS — I491 Atrial premature depolarization: Secondary | ICD-10-CM

## 2014-01-19 MED ORDER — CARVEDILOL 12.5 MG PO TABS: 12.5000 mg | ORAL_TABLET | Freq: Two times a day (BID) | ORAL | Status: DC

## 2014-01-19 NOTE — Patient Instructions (Signed)
Your physician has recommended you make the following change in your medication:  1) INCREASE Carvedilol to 12.5 mg twice daily  Please call us when you get back from [place] to schedule follow up with Dr. Graciela Husbands.

## 2014-01-19 NOTE — Progress Notes (Signed)
Patient has no care team.   HPI  Lyndel PleasureLaura F Westling is a 48 y.o. female comes in for followup for ventricular or atrial ectopy,  previouisly controlled on  Inderal LA 80; we stopped this 3/15 because of nonhealing ulcers in the context of known Raynauds   She was given Rx for dilt and coreg at last visit  Two weeks later she was seen in [place] for tachypalps and dyspnea on exertion. At home she noted herself to be hypotensive. She also been noted subsequently to be  hypertensive   Also with complaints of arthritic like pain   Treated with etodolac;  Ulcers healed .  she saw rheumatology in [place].    Monitor showed Nonsustaine atrial tach x 5 bts with PVC and PACs  In response, her carvedilol was uptitrated from 3--6 which has been associated with some improvement. She continues to have tachycardia palpitations.     Past Medical History  Diagnosis Date  . Hyperlipidemia   . Endometriosis   . H/O: hysterectomy   . Stress incontinence, female   . Atrial ectopy   . Raynaud's disease     Past Surgical History  Procedure Laterality Date  . Incontinence surgery    . Breast enhancement surgery    . Abdominal exploration surgery    . Appendectomy      Current Outpatient Prescriptions  Medication Sig Dispense Refill  . amLODipine (NORVASC) 2.5 MG tablet Take 2.5 mg by mouth daily.      . carvedilol (COREG) 6.25 MG tablet Take 1 tablet (6.25 mg total) by mouth 2 (two) times daily.  60 tablet  0  . estradiol (ESTRACE) 1 MG tablet Take 1 mg by mouth daily.      Marland Kitchen. etodolac (LODINE) 400 MG tablet Take 400 mg by mouth 2 (two) times daily.      . fexofenadine (ALLEGRA) 30 MG tablet Take 30 mg by mouth daily.      . fluticasone (FLONASE) 50 MCG/ACT nasal spray Place into both nostrils daily.      Marland Kitchen. gabapentin (NEURONTIN) 100 MG capsule Take 100 mg by mouth 3 (three) times daily.      Marland Kitchen. levothyroxine (SYNTHROID, LEVOTHROID) 50 MCG tablet Take 50 mcg by mouth daily.      .  Melatonin 5 MG CAPS Take 1 capsule by mouth at bedtime.      . Nutritional Supplements (DHEA) 15-50 MG CAPS Take 1 capsule by mouth.       . progesterone (PROMETRIUM) 200 MG capsule Take 200 mg by mouth daily.      . ranitidine (ZANTAC) 150 MG tablet Take 300 mg by mouth daily.       No current facility-administered medications for this visit.    Allergies  Allergen Reactions  . Other Anaphylaxis    Steroid (unknown) that is used for back injections     Review of Systems negative except from HPI and PMH  Physical Exam BP 131/87  Pulse 87  Ht 5\' 9"  (1.753 m)  Wt 156 lb (70.761 kg)  BMI 23.03 kg/m2 Well developed and well nourished in no acute distress HENT normal E scleral and icterus clear Neck Supple JVP flat; carotids brisk and full Clear to ausculation   regular rate and rhythm, no murmurs gallops or rub Soft with active bowel sounds No clubbing cyanosis none Edema Alert and oriented, grossly normal motor and sensory function Skin Warm and Dry   ECG demonstrates sinus rhythm at  87 intervals 13/09/30 III nerve PVCs with a superior axis  Holter monitor was reviewed from [place]. Total beats analyzed were 65,000 and she had 41 PVCs  she also has some evidence of sinus tachycardia not associated with exercisef sinus tachycardia  Assessment and  Plan  Atrial tachycardia/PVCs-symptomatic  Tachypalpitations question mechanism  Raynaud's phenomenon recently healed ulcers  Variable blood pressure hypertension and hypotension  We will plan to increase her carvedilol from 6--12 and see if this doesn't mitigate some of her sinus(presumed) tachycardia.  It may be a she'll be able to tolerate the discontinuation of her amlodipine; one question for her rheumatologist would be whether a small dose of adjunctive propranolol could be utilized in conjunction with an alpha-blocker from carvedilol or a calcium blocker. It is perhaps at [employer] property of propranolol is  particularly  beneficial  We also don not have good correlation of the mechanism of her palpitations. She did not tolerate the Holter monitor very well; she may well tolerate ZIO.  patch better

## 2014-03-30 ENCOUNTER — Telehealth: Payer: Self-pay | Admitting: Internal Medicine

## 2014-03-30 NOTE — Telephone Encounter (Signed)
New message    Patient calling wants to discuss changing medication - carvedilol  12.5 mg . Still having some problem . Should med's be increase or change.

## 2014-03-30 NOTE — Telephone Encounter (Signed)
I reviewed with Dr. Clifton JamesMcAlhany, DOD, who advised that patient should f/u with  Dr. Graciela HusbandsKlein and figure out how to wear a monitor so that continuous readings can be obtained; states he does not feel like increasing Carvedilol is best option for patient at this point.  I advised patient to make certain she is taking Carvedilol every 12 hours and that I will send message to Dr. Graciela HusbandsKlein and his primary nurse, Dory HornSherri Price, RN for f/u next week.  Patient verbalized understanding and agreement.

## 2014-03-30 NOTE — Telephone Encounter (Signed)
Spoke with patient who states she called to ask if she needs to change medication.  Patient states she feels okay today but 1 week ago she was awakened by acid reflux and her heart pounding.  Patient states she got up and went to the bathroom and felt more SOB than normal.  Patient states she felt better after 2 cups of coffee.  Patient states she has persistent fatigue but denies SOB today; denies heart racing.  Patient states she feels like the Carvedilol is not working as well as the Propanolol did but her rheumatologist told her she could not take any more Propanolol.  Patient asked about wearing the ZIO patch.  I spoke with Theodoro KosKaty Bowman, monitor technician who states that we are not currently doing any ZIO patches at this office as this was a trial.  Patient would like to know if her Carvedilol dose should be increased.  Patient states she is in [place] until late September and is supposed to f/u with Dr. Graciela HusbandsKlein at that time.  I advised patient that Dr. Graciela HusbandsKlein is out of the office this week but that I will review her concerns with one of the other physicians and will call her back with their advice.  Patient verbalized understanding and agreement.

## 2014-04-09 NOTE — Telephone Encounter (Signed)
Dr. Graciela HusbandsKlein: You recommended Zio patch for this patient - however, we are not currently using this in office at this time seocndary to billing issues. You then recommended [employer] patch - however, pt insurance not covering.  Please advise of next suggestion

## 2014-04-13 ENCOUNTER — Other Ambulatory Visit: Payer: Self-pay | Admitting: *Deleted

## 2014-04-20 NOTE — Telephone Encounter (Signed)
Pt tells me that she is still in [place], and will be returning to [place] at end of this month. She is going to call office 2 weeks before returning so that we may arrange 30 day event monitor. She will call local office in [place], who sees her when she is there, if problems arise between now and return here.

## 2014-04-20 NOTE — Telephone Encounter (Signed)
lmtcb  (offer 30 day event monitor)

## 2014-04-23 DIAGNOSIS — M5137 Other intervertebral disc degeneration, lumbosacral region: Secondary | ICD-10-CM | POA: Insufficient documentation

## 2014-04-23 DIAGNOSIS — I493 Ventricular premature depolarization: Secondary | ICD-10-CM | POA: Insufficient documentation

## 2014-04-23 DIAGNOSIS — I059 Rheumatic mitral valve disease, unspecified: Secondary | ICD-10-CM | POA: Insufficient documentation

## 2014-04-23 DIAGNOSIS — M5414 Radiculopathy, thoracic region: Secondary | ICD-10-CM | POA: Insufficient documentation

## 2014-04-23 DIAGNOSIS — M519 Unspecified thoracic, thoracolumbar and lumbosacral intervertebral disc disorder: Secondary | ICD-10-CM | POA: Insufficient documentation

## 2014-04-23 DIAGNOSIS — R5383 Other fatigue: Secondary | ICD-10-CM | POA: Insufficient documentation

## 2014-04-23 DIAGNOSIS — R252 Cramp and spasm: Secondary | ICD-10-CM | POA: Insufficient documentation

## 2014-04-23 DIAGNOSIS — M51379 Other intervertebral disc degeneration, lumbosacral region without mention of lumbar back pain or lower extremity pain: Secondary | ICD-10-CM | POA: Insufficient documentation

## 2014-04-23 DIAGNOSIS — K589 Irritable bowel syndrome without diarrhea: Secondary | ICD-10-CM | POA: Insufficient documentation

## 2014-04-23 DIAGNOSIS — M5417 Radiculopathy, lumbosacral region: Secondary | ICD-10-CM

## 2014-04-23 DIAGNOSIS — M545 Low back pain, unspecified: Secondary | ICD-10-CM | POA: Insufficient documentation

## 2014-04-23 DIAGNOSIS — M25519 Pain in unspecified shoulder: Secondary | ICD-10-CM | POA: Insufficient documentation

## 2014-04-23 DIAGNOSIS — K219 Gastro-esophageal reflux disease without esophagitis: Secondary | ICD-10-CM | POA: Insufficient documentation

## 2014-04-23 DIAGNOSIS — L739 Follicular disorder, unspecified: Secondary | ICD-10-CM | POA: Insufficient documentation

## 2014-04-23 DIAGNOSIS — M47817 Spondylosis without myelopathy or radiculopathy, lumbosacral region: Secondary | ICD-10-CM | POA: Insufficient documentation

## 2014-04-23 DIAGNOSIS — M542 Cervicalgia: Secondary | ICD-10-CM | POA: Insufficient documentation

## 2014-05-17 ENCOUNTER — Telehealth: Payer: Self-pay | Admitting: Internal Medicine

## 2014-05-17 ENCOUNTER — Other Ambulatory Visit: Payer: Self-pay | Admitting: *Deleted

## 2014-05-17 DIAGNOSIS — I471 Supraventricular tachycardia: Secondary | ICD-10-CM

## 2014-05-17 DIAGNOSIS — R002 Palpitations: Secondary | ICD-10-CM

## 2014-05-17 DIAGNOSIS — I493 Ventricular premature depolarization: Secondary | ICD-10-CM

## 2014-05-17 NOTE — Telephone Encounter (Signed)
Informed pt someone would be calling her to arrange this. She is agreeable to plan.

## 2014-05-17 NOTE — Telephone Encounter (Signed)
New message     Returning [name]'s call regarding getting a monitor.

## 2014-06-10 ENCOUNTER — Encounter (INDEPENDENT_AMBULATORY_CARE_PROVIDER_SITE_OTHER): Payer: BC Managed Care – PPO

## 2014-06-10 ENCOUNTER — Encounter: Payer: Self-pay | Admitting: *Deleted

## 2014-06-10 DIAGNOSIS — I491 Atrial premature depolarization: Secondary | ICD-10-CM

## 2014-06-10 DIAGNOSIS — I493 Ventricular premature depolarization: Secondary | ICD-10-CM

## 2014-06-10 DIAGNOSIS — R002 Palpitations: Secondary | ICD-10-CM

## 2014-06-10 DIAGNOSIS — I471 Supraventricular tachycardia: Secondary | ICD-10-CM

## 2014-06-10 NOTE — Progress Notes (Signed)
Patient ID: [name], female   DOB: [birth year], 48 y.o.   MRN: [id number] [employer] 30 day cardiac event monitor applied to patient.

## 2014-06-18 ENCOUNTER — Telehealth: Payer: Self-pay | Admitting: *Deleted

## 2014-06-18 DIAGNOSIS — I472 Ventricular tachycardia: Secondary | ICD-10-CM

## 2014-06-18 DIAGNOSIS — I4729 Other ventricular tachycardia: Secondary | ICD-10-CM

## 2014-06-18 NOTE — Telephone Encounter (Signed)
Pt is wearing an Event monitor. We were called from the event monitor agency that pt has had an 8 beats run of V tach at 10:38 AM today. Spoke with pt she states when this happed she feels her heart beating hard and she experience some SOB. Dr. Tenny Crawoss aware. She recommends for pt to have a 2-D Echo early next week. Order placed in [facility]. Pt is aware  She know to continue using the event monitor, and that a schedulers will call her for the echo appointment. Pt verbalized understanding.

## 2014-06-21 ENCOUNTER — Telehealth: Payer: Self-pay | Admitting: Internal Medicine

## 2014-06-21 NOTE — Telephone Encounter (Signed)
New message    Patient calling back to discuss heart monitor reading.

## 2014-06-21 NOTE — Telephone Encounter (Signed)
Patient called to discuss continuing monitor or stopping.  She tells me that company called her and told her not to push button more than once an hour.  I explained that she is to push the button whenever she is having symptoms and to push it however often symptoms are occurring. I also explained that we can change parameters later on if necessary. She states that she has not worn it today, but will place it back on after our conversation. She will call back with further questions/concerns.

## 2014-06-22 ENCOUNTER — Telehealth: Payer: Self-pay | Admitting: Physician Assistant

## 2014-06-22 NOTE — Telephone Encounter (Signed)
     I received a phone call from cardiac monitoring service to report an episode of atrial fibrillation with RVR ( HR 160s) that occurred today. They have faxed strips over to the office. I called the patient who is feeling well and did have a short lived episode earlier for which she triggered her event monitor. She says she has an ECHO in the office tomorrow. Will forward this to Dr. Caleen EssexKlein   Geron Mulford PA-C  [facility]

## 2014-06-23 ENCOUNTER — Ambulatory Visit (HOSPITAL_COMMUNITY): Payer: BC Managed Care – PPO | Attending: Cardiovascular Disease | Admitting: Radiology

## 2014-06-23 DIAGNOSIS — E785 Hyperlipidemia, unspecified: Secondary | ICD-10-CM | POA: Insufficient documentation

## 2014-06-23 DIAGNOSIS — I472 Ventricular tachycardia: Secondary | ICD-10-CM | POA: Diagnosis not present

## 2014-06-23 DIAGNOSIS — I4729 Other ventricular tachycardia: Secondary | ICD-10-CM

## 2014-06-23 NOTE — Progress Notes (Signed)
Echocardiogram performed.  

## 2014-06-24 NOTE — Telephone Encounter (Signed)
Called patient to discuss new Afib discover by monitor.  Patient scheduled to see Dr. Graciela HusbandsKlein when he returns to office 11/19. Patient is agreeable to plan.

## 2014-06-28 ENCOUNTER — Telehealth: Payer: Self-pay | Admitting: *Deleted

## 2014-06-28 NOTE — Telephone Encounter (Signed)
Received recordings from [employer] for afib w/RVR sustained from 06/25/14. Called pt who states she is in [place] to see her son in [employer].  States she was at [employer] movie Fri nite when she had some lightheadedness and some chest pressure.  According to recordings lasted from 9:38 - 11:43 CT.  States when she left the movie theater she was a little lightheaded but after awhile was fine.  States over the weekend she had a couple of episodes but not significant. Was getting ready at this time to get on a plane to come back here. States she feels good now.  Is not on preventive med due to CAD score.  She also wanted Dory HornSherri Price to follow up to see if we had received Echo from [facility] office that was done in March or April. Has an appointment w/ Dr. Graciela HusbandsKlein on 11/19.

## 2014-07-05 ENCOUNTER — Telehealth: Payer: Self-pay | Admitting: *Deleted

## 2014-07-05 NOTE — Telephone Encounter (Signed)
Received event transmission from 11/13 (Friday) showing Sinus Tach w/Bigeminal PVC's.  Pt states she was sitting watching TV and felt heart rate beating fast.  States she has been fine the rest of the weekend.  States she just feels "bad" all the time. Is SOB esp when walking for any distance.  Scheduled to see Dr. Graciela HusbandsKlein on 11/19.  She also wanted to know if she should continue wearing monitor.  States she didn't wear it yesterday or hasn't been wearing it at night.  Advised to continue wearing until seen by Dr. Graciela HusbandsKlein. She understands and will continue to wear.  Advised to call us back if the heart rate continues to be fast and doesn't resolve.  She verbalizes understanding.

## 2014-07-08 ENCOUNTER — Ambulatory Visit (INDEPENDENT_AMBULATORY_CARE_PROVIDER_SITE_OTHER): Payer: BC Managed Care – PPO | Admitting: Internal Medicine

## 2014-07-08 ENCOUNTER — Encounter: Payer: Self-pay | Admitting: Internal Medicine

## 2014-07-08 VITALS — BP 118/74 | HR 86 | Ht 69.0 in | Wt 154.0 lb

## 2014-07-08 DIAGNOSIS — R0609 Other forms of dyspnea: Secondary | ICD-10-CM

## 2014-07-08 DIAGNOSIS — I341 Nonrheumatic mitral (valve) prolapse: Secondary | ICD-10-CM

## 2014-07-08 DIAGNOSIS — I340 Nonrheumatic mitral (valve) insufficiency: Secondary | ICD-10-CM

## 2014-07-08 DIAGNOSIS — I4729 Other ventricular tachycardia: Secondary | ICD-10-CM

## 2014-07-08 DIAGNOSIS — I4891 Unspecified atrial fibrillation: Secondary | ICD-10-CM

## 2014-07-08 DIAGNOSIS — I480 Paroxysmal atrial fibrillation: Secondary | ICD-10-CM

## 2014-07-08 DIAGNOSIS — I472 Ventricular tachycardia: Secondary | ICD-10-CM

## 2014-07-08 DIAGNOSIS — I10 Essential (primary) hypertension: Secondary | ICD-10-CM

## 2014-07-08 MED ORDER — DILTIAZEM HCL ER COATED BEADS 120 MG PO CP24: 120.0000 mg | ORAL_CAPSULE | Freq: Every day | ORAL | Status: DC

## 2014-07-08 MED ORDER — FLECAINIDE ACETATE 100 MG PO TABS: 100.0000 mg | ORAL_TABLET | Freq: Two times a day (BID) | ORAL | Status: DC

## 2014-07-08 NOTE — Progress Notes (Signed)
No care team member to display   HPI  Caroline PleasureLaura F Alviar is a 48 y.o. female comes in for followup for ventricular or atrial ectopy,  previouisly controlled on  Inderal LA 80; we stopped this 3/15 because of nonhealing ulcers in the context of known Raynauds   She has had significant problems with palpitations mechanisms of which is not clear. She finally received a 30 day event recorder which demonstrated atrial fibrillation with a very rapid rate as of 200 beats per minute.   She also had evidence of wide complex bigeminy in the context of sinus rhythmand nonsustained ventricular tachycardia  Echocardiogram 11/15 demonstrated normal LV function with a left atrial margin and (42/2.3/39)\  There is mitral Valve prolapse estimated is mild with mild-moderate mitral regurgitation   she complains of dyspnea on exertion over the last 6 months since the discontinuation of the propranolol.    Past Medical History  Diagnosis Date  . Hyperlipidemia   . Endometriosis   . H/O: hysterectomy   . Stress incontinence, female   . Atrial ectopy   . Raynaud's disease     Past Surgical History  Procedure Laterality Date  . Incontinence surgery    . Breast enhancement surgery    . Abdominal exploration surgery    . Appendectomy      Current Outpatient Prescriptions  Medication Sig Dispense Refill  . carvedilol (COREG) 12.5 MG tablet Take 1 tablet (12.5 mg total) by mouth 2 (two) times daily. 60 tablet 6  . cetirizine (ZYRTEC) 10 MG tablet Take 10 mg by mouth daily.    Marland Kitchen. estradiol (ESTRACE) 1 MG tablet Take 1 mg by mouth daily.    Marland Kitchen. etodolac (LODINE) 400 MG tablet Take 400 mg by mouth 2 (two) times daily.    . fluticasone (FLONASE) 50 MCG/ACT nasal spray Place into both nostrils daily.    Marland Kitchen. gabapentin (NEURONTIN) 100 MG capsule Take 100 mg by mouth 3 (three) times daily.    Marland Kitchen. levothyroxine (SYNTHROID, LEVOTHROID) 50 MCG tablet Take 50 mcg by mouth daily.    . Melatonin 5 MG CAPS  Take 1 capsule by mouth at bedtime.    . Nutritional Supplements (DHEA) 15-50 MG CAPS Take 1 capsule by mouth.     . progesterone (PROMETRIUM) 200 MG capsule Take 200 mg by mouth daily.    . ranitidine (ZANTAC) 150 MG tablet Take 300 mg by mouth daily.     No current facility-administered medications for this visit.    Allergies  Allergen Reactions  . Other Anaphylaxis    Steroid (unknown) that is used for back injections     Review of Systems negative except from HPI and PMH  Physical Exam BP 118/74 mmHg  Pulse 86  Ht 5\' 9"  (1.753 m)  Wt 69.854 kg (154 lb)  BMI 22.73 kg/m2 Well developed and well nourished in no acute distress HENT normal E scleral and icterus clear Neck Supple JVP flat; carotids brisk and full Clear to ausculation   regular rate and rhythm, no murmurs gallops or rub Soft with active bowel sounds No clubbing cyanosis none Edema Alert and oriented, grossly normal motor and sensory function Skin Warm and Dry   ECG demonstrates sinus rhythm at 87 intervals 13/09/30 III nerve PVCs with a superior axis  Holter monitor was reviewed from [place]. Total beats analyzed were 65,000 and she had 41 PVCs  she also has some evidence of sinus tachycardia not associated with exercisef sinus  tachycardia  Assessment and  Plan  Atrial tachycardia/PVCs-symptomatic  Atrial fibrillation RVR   Raynaud's phenomenon recently healed ulcers  Variable blood pressure hypertension and hypotension   nonsustained ventricular tachycardia  Dyspnea on exertion  Mitral valve prolapse with mild-moderate MR  The patient has atrial fibrillation in the context of mitral valve prolapse and mitral regurgitation. She also has nonsustained ventricular tachycardia. Left atrial enlargement mitral rises a consequence of her mitral regurgitation and/or diastolic dysfunction. We will review her echo to look at these parameters. There may be a role for transesophageal echo to further assess  mitral regurgitation as well as stress echo to see whether her dyspnea on exertion as a consequence of exercise associated mitral regurgitation or aggravated pulmonary hypertension  Her CHADS-VASc score is 2. Her intravenous score is quite low given her age and I told her that while the CHADS-VASc guidelines the subject is appropriate to pursue anticoagulation recent studies have been using atria and the attenuated impact of the knee but not quite as clearly the right thing to do. She will consider whether we will begin apixaban or not.  Will be to undertake treadmill testing following the initiation of flecainide for atrial fibrillation which we will have her rates in atrial fibrillation are in excess of 250. We will have a low threshold for consideration of catheter ablation.  Carvedilol was inadequate for rate control; we will begin her on Cardizem.

## 2014-07-08 NOTE — Patient Instructions (Addendum)
Your physician has recommended you make the following change in your medication:  1) STOP Carvedilol 2) START Diltiazem 120 daily 3) START Flecainide 100 mg twice daily on 07/16/14  Your physician has requested that you have an exercise tolerance test. For further information please visit [email or web address]. Please also follow instruction sheet, as given.  You are scheduled fro 07/20/14 at 2:30 pm.  Follow up to be determined after stress testing.

## 2014-07-12 ENCOUNTER — Telehealth: Payer: Self-pay | Admitting: Internal Medicine

## 2014-07-12 MED ORDER — DILTIAZEM HCL ER COATED BEADS 120 MG PO CP24: 240.0000 mg | ORAL_CAPSULE | Freq: Every day | ORAL | Status: DC

## 2014-07-12 NOTE — Telephone Encounter (Signed)
New message  Pt called requets a call a back to discuss having a servere episode over the weekend. She says she started feeling better. Pt request a call back to determine if a medical change is needed

## 2014-07-12 NOTE — Telephone Encounter (Signed)
Advised to increase Diltiazem to 240 mg daily, per Dr. Graciela HusbandsKlein. Patient is agreeable and will call me if symptoms arise again.

## 2014-07-12 NOTE — Telephone Encounter (Signed)
Informs me of symptoms that lasted all morning on Saturday - fast heart rate, couldn't breathe, bilateral arm weakness, esophageal spasms, felt like almost blacking out. She states that she felt like she did when she went to the hospital.  Her husband called EMS, patient felt better and didn't want them to come but they came and checked on her anyway. She states that EMS stated HR was  AFib in 240s when they arrived but decreased down to 100 before they left.  She would like to know Dr. Odessa FlemingKlein's recommendations.  Informed her that I would review with her and get back with her - she is agreeable to this.

## 2014-07-14 ENCOUNTER — Other Ambulatory Visit: Payer: Self-pay | Admitting: *Deleted

## 2014-07-14 ENCOUNTER — Telehealth: Payer: Self-pay | Admitting: Internal Medicine

## 2014-07-14 DIAGNOSIS — I340 Nonrheumatic mitral (valve) insufficiency: Secondary | ICD-10-CM

## 2014-07-14 DIAGNOSIS — R0609 Other forms of dyspnea: Principal | ICD-10-CM

## 2014-07-14 MED ORDER — DILTIAZEM HCL ER COATED BEADS 240 MG PO CP24: 240.0000 mg | ORAL_CAPSULE | Freq: Every day | ORAL | Status: DC

## 2014-07-14 NOTE — Telephone Encounter (Signed)
New Msg  Patient calling states she would like a call back because she has thought of a few more questions to ask. Contact at [phone]

## 2014-07-14 NOTE — Telephone Encounter (Signed)
Patient wanted Diltiazem rx sent in with 240 mg tablets. Completed and sent to request [facility].

## 2014-07-19 ENCOUNTER — Telehealth: Payer: Self-pay | Admitting: Internal Medicine

## 2014-07-19 NOTE — Telephone Encounter (Signed)
New message      Want nurse to know that she had an "episode" on Saturday morning.  She was in AFIB and V-tach.  It lasted a little over 2hrs.  Yesterday afternoon she was really tired but no other symptoms.

## 2014-07-20 ENCOUNTER — Encounter: Payer: BC Managed Care – PPO | Admitting: Internal Medicine

## 2014-07-21 NOTE — Telephone Encounter (Signed)
Advised patient to start Flecainide tonight, per Dr. Graciela HusbandsKlein, and no exercising until treadmill testing next week. Patient explains that she has not exercised in over a year, so this won't be an issue. Patient verbalized understanding and agreeable to plan.

## 2014-07-26 ENCOUNTER — Telehealth: Payer: Self-pay | Admitting: Internal Medicine

## 2014-07-26 NOTE — Telephone Encounter (Signed)
Advised med sent to pharmacy was Diltiazem long acting form. She verbalized understanding and wanted to confirm before filling.

## 2014-07-26 NOTE — Telephone Encounter (Signed)
New Message        Pt calling stating she was out of Diltiazim and a rx was called in for her but when she picked up her rx it was [place] xt sr 240mg . Pt needs clarification on if this is the correct medication. Please call back and advise.

## 2014-07-28 ENCOUNTER — Ambulatory Visit (HOSPITAL_COMMUNITY): Payer: BC Managed Care – PPO | Attending: Cardiovascular Disease

## 2014-07-28 ENCOUNTER — Ambulatory Visit (HOSPITAL_BASED_OUTPATIENT_CLINIC_OR_DEPARTMENT_OTHER): Payer: BC Managed Care – PPO | Admitting: Radiology

## 2014-07-28 DIAGNOSIS — R0989 Other specified symptoms and signs involving the circulatory and respiratory systems: Secondary | ICD-10-CM

## 2014-07-28 DIAGNOSIS — R0609 Other forms of dyspnea: Secondary | ICD-10-CM

## 2014-07-28 DIAGNOSIS — I340 Nonrheumatic mitral (valve) insufficiency: Secondary | ICD-10-CM | POA: Insufficient documentation

## 2014-07-28 DIAGNOSIS — R0600 Dyspnea, unspecified: Secondary | ICD-10-CM | POA: Insufficient documentation

## 2014-07-28 NOTE — Progress Notes (Signed)
Stress Echocardiogram performed.  

## 2014-08-02 ENCOUNTER — Telehealth: Payer: Self-pay | Admitting: Internal Medicine

## 2014-08-02 NOTE — Telephone Encounter (Signed)
New Msg   Pt wants to know results of stress echocardiogram.  Pt also needs to know if meds needs to be changed and if she can start exercising?   Please contact [phone].

## 2014-08-03 NOTE — Telephone Encounter (Signed)
lmtcb

## 2014-08-03 NOTE — Telephone Encounter (Signed)
Patient informed of stress echo with MVP present, but no worse with exercise. Advised she can exercise. States she feels she ha A Fib daily, not continuous. Feels fatigued. Gets tachy with minimal exertion. Wants to know  If meds can be adjusted in any way.

## 2014-08-16 NOTE — Telephone Encounter (Signed)
Dr. Graciela HusbandsKlein, Please address this.

## 2014-08-26 NOTE — Telephone Encounter (Signed)
Dr. Graciela HusbandsKlein has been trying to reach the patient for several weeks.  He will continue to try and reach patient.

## 2014-09-03 NOTE — Telephone Encounter (Signed)
Spoke with patient today. She tells me she is feeling better, but not like she was before March (before stopping the Propranolol secondary to Raynaud's). She states she has AFib almost daily. When asked how long episodes are lasting she states she is used to feeling it, so she doesn't pay attention to when it stops. She would like to discuss the possibility of going back on Propranolol 20 mg BID. She states she is on high dose calcium channel blocker and will deal with Raynaud issues and Propranolol. She states that this medication worked so well for her for so many years. Patient aware that it may be next week before calling her back with recommendations from Dr. Graciela HusbandsKlein. She is agreeable.

## 2014-09-14 ENCOUNTER — Other Ambulatory Visit: Payer: Self-pay | Admitting: *Deleted

## 2014-09-14 ENCOUNTER — Telehealth: Payer: Self-pay | Admitting: Internal Medicine

## 2014-09-14 MED ORDER — PROPRANOLOL HCL 20 MG PO TABS
20.0000 mg | ORAL_TABLET | Freq: Two times a day (BID) | ORAL | Status: DC
Start: 2014-09-14 — End: 2015-01-04

## 2014-09-14 NOTE — Telephone Encounter (Signed)
Advised to restart Propranolol.  Rx sent to [facility] Patient aware we will contact her to arrange office visit with Dr. Graciela HusbandsKlein.

## 2014-09-14 NOTE — Telephone Encounter (Signed)
New Message  Pt returned call. She also states that she had a bad episode yesterday 01/25 around 4:15 for about 2 hours. Requests a call back to discuss.

## 2014-09-14 NOTE — Telephone Encounter (Signed)
Patient tells me episode happened yesterday afternoon.  [name] started feeling hot, then heart pounding, sweating. [name] took BP and states normal, but machine said HR in 190s.  HR stayed elevated for 2 hours. [name] would like Dr. Graciela HusbandsKlein to give recommendations. Told patient I would call her after reviewing with him - today. [name] is agreeable.

## 2014-09-24 NOTE — Telephone Encounter (Signed)
Late entry: Patient and I spoke end of last week. She is going to take this medication for awhile to see if this helps before she wants to schedule office visit. She is aware Dr. Graciela HusbandsKlein will review and let us know if he would like her to still come in. I will call her if this is his recommendations.

## 2014-10-19 ENCOUNTER — Other Ambulatory Visit: Payer: Self-pay | Admitting: Internal Medicine

## 2014-10-21 ENCOUNTER — Other Ambulatory Visit: Payer: Self-pay

## 2014-10-21 MED ORDER — DILTIAZEM HCL ER COATED BEADS 240 MG PO CP24: 240.0000 mg | ORAL_CAPSULE | Freq: Every day | ORAL | Status: DC

## 2014-11-02 NOTE — Telephone Encounter (Signed)
Called patient to see how she was doing currently.  She tells me that she has been much improved since re-starting Propranolol. However, she is now experiencing sores on toes secondary to Raynaud's related to medication.  She isn't bothered by this, but I informed her that we should follow up as she and her [place] are leaving [place] and will be there for possibly a year. She is agreeable and aware scheduler will call her to arrange her to be seen in the next few weeks.

## 2014-11-04 ENCOUNTER — Ambulatory Visit (INDEPENDENT_AMBULATORY_CARE_PROVIDER_SITE_OTHER): Payer: BLUE CROSS/BLUE SHIELD | Admitting: Internal Medicine

## 2014-11-04 ENCOUNTER — Encounter: Payer: Self-pay | Admitting: Internal Medicine

## 2014-11-04 VITALS — BP 140/80 | HR 83 | Ht 69.0 in | Wt 152.2 lb

## 2014-11-04 DIAGNOSIS — I472 Ventricular tachycardia: Secondary | ICD-10-CM

## 2014-11-04 DIAGNOSIS — I4729 Other ventricular tachycardia: Secondary | ICD-10-CM

## 2014-11-04 DIAGNOSIS — I480 Paroxysmal atrial fibrillation: Secondary | ICD-10-CM

## 2014-11-04 NOTE — Patient Instructions (Addendum)
Your physician recommends that you continue on your current medications as directed. Please refer to the Current Medication list given to you today.  Please call us after furniture market and let us know when you can come back in for follow up.

## 2014-11-04 NOTE — Progress Notes (Signed)
No care team member to display   HPI  Caroline PleasureLaura F Reyburn is a 49 y.o. female comes in for followup for ventricular or atrial ectopy,  previouisly controlled on  Inderal LA 80; we stopped this 3/15 because of nonhealing ulcers in the context of known Raynauds   She has had significant problems with palpitations  finally  demonstrated atrial fibrillation with a very rapid rate as of 200 beats per minute.  While CHADSVASc 2 the data from [facility] suggests that risk is strongly related to age and she wsa not inclined to start apixoban    She also had evidence of wide complex bigeminy in the context of sinus rhythm and nonsustained ventricular tachycardia  Echocardiogram 11/15 demonstrated normal LV function with a left atrial margin and (42/2.3/39)\  There is mitral Valve prolapse estimated is mild with mild-moderate mitral regurgitation  She had been on propranolol in the past but stopped bvecause of raynauds  Past Medical History  Diagnosis Date  . Hyperlipidemia   . Endometriosis   . H/O: hysterectomy   . Stress incontinence, female   . Atrial ectopy   . Raynaud's disease     Past Surgical History  Procedure Laterality Date  . Incontinence surgery    . Breast enhancement surgery    . Abdominal exploration surgery    . Appendectomy      Current Outpatient Prescriptions  Medication Sig Dispense Refill  . cetirizine (ZYRTEC) 10 MG tablet Take 10 mg by mouth daily.    Marland Kitchen. diltiazem (CARTIA XT) 240 MG 24 hr capsule Take 1 capsule (240 mg total) by mouth daily. 30 capsule 2  . estradiol (ESTRACE) 1 MG tablet Take 1 mg by mouth daily.    Marland Kitchen. etodolac (LODINE) 400 MG tablet Take 400 mg by mouth 2 (two) times daily.    . flecainide (TAMBOCOR) 100 MG tablet Take 1 tablet (100 mg total) by mouth 2 (two) times daily. 60 tablet 3  . gabapentin (NEURONTIN) 100 MG capsule Take 100 mg by mouth 3 (three) times daily.    Marland Kitchen. levothyroxine (SYNTHROID, LEVOTHROID) 50 MCG tablet Take 50 mcg  by mouth daily.    . Melatonin 5 MG CAPS Take 1 capsule by mouth at bedtime.    . Nutritional Supplements (DHEA) 15-50 MG CAPS Take 1 capsule by mouth.     . progesterone (PROMETRIUM) 200 MG capsule Take 200 mg by mouth daily.    . propranolol (INDERAL) 20 MG tablet Take 1 tablet (20 mg total) by mouth 2 (two) times daily. 60 tablet 3  . ranitidine (ZANTAC) 150 MG tablet Take 300 mg by mouth daily.    . sertraline (ZOLOFT) 50 MG tablet Take 50 mg by mouth daily.  1  . traMADol (ULTRAM) 50 MG tablet Take 50 mg by mouth as needed. For pain  0   No current facility-administered medications for this visit.    Allergies  Allergen Reactions  . Other Anaphylaxis    Steroid (unknown) that is used for back injections     Review of Systems negative except from HPI and PMH  Physical Exam There were no vitals taken for this visit. Well developed and well nourished in no acute distress HENT normal E scleral and icterus clear Neck Supple JVP flat; carotids brisk and full Clear to ausculation   regular rate and rhythm, no murmurs gallops or rub Soft with active bowel sounds No clubbing cyanosis none Edema Alert and oriented, grossly normal motor  and sensory function Skin Warm and Dry   ECG demonstrates sinus rhythm at 87 intervals 13/09/30 III nerve PVCs with a superior axis  Holter monitor was reviewed from [place]. Total beats analyzed were 65,000 and she had 41 PVCs  she also has some evidence of sinus tachycardia not associated with exercisef sinus tachycardia  Assessment and  Plan  Atrial tachycardia/PVCs-symptomatic  Atrial fibrillation RVR   Raynaud's phenomenon recently healed ulcers  Variable blood pressure hypertension and hypotension   nonsustained ventricular tachycardia  Dyspnea on exertion  Mitral valve prolapse with mild-moderate MR  The patient has atrial fibrillation in the context of mitral valve prolapse and mitral regurgitation. She also has nonsustained  ventricular tachycardia. Left atrial enlargement mitral rises a consequence of her mitral regurgitation and/or diastolic dysfunction. We will review her echo to look at these parameters. There may be a role for transesophageal echo to further assess mitral regurgitation as well as stress echo to see whether her dyspnea on exertion as a consequence of exercise associated mitral regurgitation or aggravated pulmonary hypertension

## 2014-11-18 ENCOUNTER — Other Ambulatory Visit: Payer: Self-pay | Admitting: Internal Medicine

## 2014-11-18 ENCOUNTER — Telehealth: Payer: Self-pay | Admitting: Internal Medicine

## 2014-11-18 MED ORDER — DILTIAZEM HCL ER COATED BEADS 240 MG PO CP24: 240.0000 mg | ORAL_CAPSULE | Freq: Every day | ORAL | Status: DC

## 2014-11-18 MED ORDER — FLECAINIDE ACETATE 100 MG PO TABS: 100.0000 mg | ORAL_TABLET | Freq: Two times a day (BID) | ORAL | Status: DC

## 2014-11-18 NOTE — Telephone Encounter (Signed)
New message    Patient calling from last office visit was advise to call the office speak with nurse.

## 2014-11-18 NOTE — Telephone Encounter (Signed)
Patient called in to tell me the dates that she is able to follow up with Dr. Graciela HusbandsKlein. She would like to come in October (after furniture market) Advised I would put in a recall for her.

## 2014-12-03 ENCOUNTER — Other Ambulatory Visit: Payer: Self-pay | Admitting: Obstetrics and Gynecology

## 2014-12-03 DIAGNOSIS — R928 Other abnormal and inconclusive findings on diagnostic imaging of breast: Secondary | ICD-10-CM

## 2014-12-09 ENCOUNTER — Ambulatory Visit
Admission: RE | Admit: 2014-12-09 | Discharge: 2014-12-09 | Disposition: A | Discharge status: Home / Self Care | Payer: BLUE CROSS/BLUE SHIELD | Source: Ambulatory Visit | Attending: Obstetrics and Gynecology | Admitting: Obstetrics and Gynecology

## 2014-12-09 DIAGNOSIS — R928 Other abnormal and inconclusive findings on diagnostic imaging of breast: Secondary | ICD-10-CM

## 2015-01-04 ENCOUNTER — Other Ambulatory Visit: Payer: Self-pay | Admitting: Internal Medicine

## 2015-01-05 NOTE — Telephone Encounter (Signed)
Entered in error

## 2015-01-14 ENCOUNTER — Telehealth: Payer: Self-pay | Admitting: Internal Medicine

## 2015-01-14 NOTE — Telephone Encounter (Signed)
New Message      Pt calling stating [place] and everytime she is in the sun she is having some type of "tachycardia episode" and can't walk more than 40 ft  without being out of breathe. Please call back and advise.

## 2015-01-14 NOTE — Telephone Encounter (Signed)
Patient describes being at beach several times.  When she stands up and starts folding towels/packing up, she is unable to breath and HR is increased.  She has not palpated heart rate but feels it racing.  Reports weather at about 89 degrees and she was out in sun for about 1 1/2 - 2 hours. States that she drinks about 3-4 L of water/day.  Asked her how much fluids she drinks while at beach and she states maybe not enough.  Explained the concern of temporary dehydrating herself by not drinking a lot while in the heat.  Advised to increase intake of fluids while in the sun for extended period of time.  Also advised to go to urgent care or cardiologist, she sees in [place], to obtain EKG with these occurences to see what heart rhythm is doing.  Patient is agreeable and will call back if problem persists or she finds out the cause.

## 2015-01-14 NOTE — Telephone Encounter (Signed)
lmtcb

## 2015-02-14 ENCOUNTER — Other Ambulatory Visit: Payer: Self-pay | Admitting: Internal Medicine

## 2015-05-09 ENCOUNTER — Other Ambulatory Visit: Payer: Self-pay | Admitting: Internal Medicine

## 2015-07-11 ENCOUNTER — Ambulatory Visit: Payer: BLUE CROSS/BLUE SHIELD | Admitting: Internal Medicine

## 2015-08-11 ENCOUNTER — Other Ambulatory Visit: Payer: Self-pay | Admitting: Internal Medicine

## 2015-10-03 ENCOUNTER — Other Ambulatory Visit: Payer: Self-pay | Admitting: *Deleted

## 2015-10-03 MED ORDER — FLECAINIDE ACETATE 100 MG PO TABS: 100.0000 mg | ORAL_TABLET | Freq: Two times a day (BID) | ORAL | Status: DC

## 2015-10-10 ENCOUNTER — Encounter: Payer: Self-pay | Admitting: Internal Medicine

## 2015-10-10 ENCOUNTER — Ambulatory Visit (INDEPENDENT_AMBULATORY_CARE_PROVIDER_SITE_OTHER): Payer: BLUE CROSS/BLUE SHIELD | Admitting: Internal Medicine

## 2015-10-10 VITALS — BP 128/76 | HR 79 | Ht 69.0 in | Wt 166.0 lb

## 2015-10-10 DIAGNOSIS — I493 Ventricular premature depolarization: Secondary | ICD-10-CM

## 2015-10-10 DIAGNOSIS — I471 Supraventricular tachycardia: Secondary | ICD-10-CM

## 2015-10-10 DIAGNOSIS — I4729 Other ventricular tachycardia: Secondary | ICD-10-CM

## 2015-10-10 DIAGNOSIS — I472 Ventricular tachycardia: Secondary | ICD-10-CM

## 2015-10-10 DIAGNOSIS — I480 Paroxysmal atrial fibrillation: Secondary | ICD-10-CM | POA: Diagnosis not present

## 2015-10-10 NOTE — Progress Notes (Signed)
Patient Care Team: No Pcp Per Patient as PCP - General (General Practice)   HPI  [name] is a 50 y.o. female comes in for followup for ventricular or atrial ectopy,  previouisly controlled on  Inderal LA 80; we stopped this 3/15 because of nonhealing ulcers in the context of known Raynauds   She has had significant problems with palpitations  finally  demonstrated atrial fibrillation with a very rapid rate as of 200 beats per minute.  While CHADSVASc 2 the data from [facility] suggests that risk is strongly related to age and she was not inclined to start apixoban   She also had evidence of wide complex bigeminy in the context of sinus rhythm and nonsustained ventricular tachycardia  Echocardiogram 11/15 demonstrated normal LV function with a left atrial margin and (42/2.3/39)\  There is mitral Valve prolapse estimated with mild with mild-moderate mitral regurgitation  . December 2015 she underwent a stress echo demonstrating no significant increase in her MR. However, she continues to be limited by dyspnea with exertion. This occurs without evidence of edema. She does not have nocturnal dyspnea.  There was no comment on her echo 11/15 related to pulmonary pressures.  She had been on propranolol in the past but stopped because of raynauds and digital ulcers on her feet. However, she has resumed it with a recurrence of the ulcer abutted impact on her palpitations is so great that she is not interested in considering discontinuing it at this time.  Past Medical History  Diagnosis Date  . Hyperlipidemia   . Endometriosis   . H/O: hysterectomy   . Stress incontinence, female   . Atrial ectopy   . Raynaud's disease     Past Surgical History  Procedure Laterality Date  . Incontinence surgery    . Breast enhancement surgery    . Abdominal exploration surgery    . Appendectomy      Current Outpatient Prescriptions  Medication Sig Dispense Refill  . cetirizine (ZYRTEC)  10 MG tablet Take 10 mg by mouth daily.    Marland Kitchen diltiazem (CARDIZEM CD) 240 MG 24 hr capsule TAKE 1 CAPSULE BY MOUTH DAILY 30 capsule 3  . estradiol (ESTRACE) 1 MG tablet Take 1 mg by mouth daily.    Marland Kitchen etodolac (LODINE) 400 MG tablet Take 400 mg by mouth 2 (two) times daily.    . flecainide (TAMBOCOR) 100 MG tablet Take 1 tablet (100 mg total) by mouth 2 (two) times daily. 60 tablet 0  . levothyroxine (SYNTHROID, LEVOTHROID) 50 MCG tablet Take 50 mcg by mouth daily.    . Melatonin 5 MG CAPS Take 1 capsule by mouth at bedtime.    . Nutritional Supplements (DHEA) 15-50 MG CAPS Take 1 capsule by mouth.     . progesterone (PROMETRIUM) 200 MG capsule Take 200 mg by mouth daily.    . propranolol (INDERAL) 20 MG tablet TAKE ONE TABLET BY MOUTH TWICE DAILY 60 tablet 5  . ranitidine (ZANTAC) 150 MG tablet Take 300 mg by mouth daily.    . sodium chloride (OCEAN) 0.65 % SOLN nasal spray Place 2 sprays into both nostrils as needed for congestion.     No current facility-administered medications for this visit.    Allergies  Allergen Reactions  . Other Anaphylaxis    Steroid (unknown) that is used for back injections     Review of Systems negative except from HPI and PMH  Physical Exam BP 128/76 mmHg  Pulse  79  Ht  (1.753 m)  Wt 166 lb (75.297 kg)  BMI 24.50 kg/m2 Well developed and well nourished in no acute distress HENT normal E scleral and icterus clear Neck Supple JVP flat; carotids brisk and full Clear to ausculation   regular rate and rhythm, no murmurs gallops or rub Soft with active bowel sounds No clubbing cyanosis none Edema Alert and oriented, grossly normal motor and sensory function Skin Warm and Dry   ECG demonstrates sinus rhythm at 87 intervals 13/09/30 III nerve PVCs with a superior axis  Holter monitor was reviewed from [place]. Total beats analyzed were 65,000 and she had 41 PVCs  she also has some evidence of sinus tachycardia not associated with exercisef  sinus tachycardia  Assessment and  Plan  Atrial tachycardia/PVCs-symptomatic  Atrial fibrillation RVR   Raynaud's phenomenon recently healed ulcers Hypertension    Nonsustained ventricular tachycardia  Dyspnea on exertion  Mitral valve prolapse with mild-moderate MR  She continues to struggle with significant symptomatic shortness of breath.  I am concerned about how her MR may be contributing.  We did a stress echo a year ago and noted no significant change in MR. The transthoracic and raised the possibility, with eccentric jet, that MR was more severe than appreciated.  I'm going to ask Dr. Jens Som a few with see her and reviewed these data with her as the dyspnea is noteworthy.   At this point she would like to continue taking the beta blockers not withstanding the Raynaud's associated ulcer on her toe

## 2015-10-10 NOTE — Patient Instructions (Signed)

## 2015-10-20 ENCOUNTER — Other Ambulatory Visit: Payer: Self-pay | Admitting: *Deleted

## 2015-10-20 MED ORDER — PROPRANOLOL HCL 20 MG PO TABS: 20.0000 mg | ORAL_TABLET | Freq: Two times a day (BID) | ORAL | Status: AC

## 2015-10-24 ENCOUNTER — Telehealth: Payer: Self-pay | Admitting: Internal Medicine

## 2015-10-24 NOTE — Telephone Encounter (Signed)
New message      Pt saw rheumatologist today.  He wanted pt to ask about changing calcium channel blocker; also, pt states she is having a hard time getting someone in Dr Ludwig Clarksrenshaw's office to schedule an appt.  Maybe you can help her.  Please call.

## 2015-10-24 NOTE — Telephone Encounter (Signed)
Spoke with pt, Follow up scheduled with dr Jens Somcrenshaw. The pt needs to know from dr Graciela Husbandsklein about switching from diltiazem to norvasc or nifedipine. The pt has raynaud's and they would like to switch the pt to a calcium channel blocker that will not bother the raynaud's as much. Will forward for dr klein's review

## 2015-10-26 NOTE — Telephone Encounter (Signed)
The choice of dilt has also been for palpitations  We can switch to whatever, but her symptoms may worsen and she can dictate what she would like to do

## 2015-10-28 NOTE — Telephone Encounter (Signed)
Left message for pt to call.

## 2015-10-31 ENCOUNTER — Other Ambulatory Visit: Payer: Self-pay | Admitting: Internal Medicine

## 2015-11-07 ENCOUNTER — Telehealth: Payer: Self-pay | Admitting: Internal Medicine

## 2015-11-07 MED ORDER — AMLODIPINE BESYLATE 10 MG PO TABS: 10.0000 mg | ORAL_TABLET | Freq: Every day | ORAL | Status: DC

## 2015-11-07 NOTE — Telephone Encounter (Signed)
Will forward to Dr. Klein to review. 

## 2015-11-07 NOTE — Telephone Encounter (Signed)
New message     Patient saw rhheumatologist on  3.6.2017  MD wanted to know if Dr. Graciela HusbandsKlein would consider changing  her calcium changer blocker to a different medication .

## 2015-11-07 NOTE — Telephone Encounter (Signed)
I spoke with the patient- norvasc or nifedipine are recommendations from the patient's rheumatologist. Per Dr. Graciela HusbandsKlein- start norvasc 10 mg once daily. RX will be sent to [facility] in [employer] on [address].

## 2015-11-07 NOTE — Telephone Encounter (Signed)
They can try whatever they would like  The issue for her has been her palps

## 2015-11-08 NOTE — Telephone Encounter (Signed)
See telephone note from 11-07-15

## 2015-11-21 NOTE — Progress Notes (Signed)
HPI: 50 yo female followed by Dr Graciela Husbands for evaluation of MR. Cardiac MRI July 2007 showed normal RV size and function with no evidence of fatty infiltration, ejection fraction 53% with no scar or enhancement. Echocardiogram November 2015 showed normal LV function, mitral valve prolapse and mild to moderate mitral regurgitation. Mild left atrial enlargement. Stress echocardiogram December 2015 showed mild to moderate mitral regurgitation at rest with no change in severity with exercise. Also with history of paroxysmal atrial fibrillation and atrial Tach/PVCs. Previously discontinued propranolol because of Raynaud's and digital ulcers on her feet. She has noticed increasing dyspnea and I was asked to evaluate. Patient notes dyspnea with extreme activities but not routine activities. No orthopnea, PND, pedal edema, chest pain or syncope. Her palpitations are well controlled on propranolol.  Current Outpatient Prescriptions  Medication Sig Dispense Refill  . amLODipine (NORVASC) 10 MG tablet Take 1 tablet (10 mg total) by mouth daily. 30 tablet 6  . cetirizine (ZYRTEC) 10 MG tablet Take 10 mg by mouth daily.    Marland Kitchen estradiol (ESTRACE) 1 MG tablet Take 1 mg by mouth daily.    Marland Kitchen etodolac (LODINE) 400 MG tablet Take 400 mg by mouth 2 (two) times daily.    . flecainide (TAMBOCOR) 100 MG tablet TAKE 1 TABLET TWICE DAILY 60 tablet 10  . levothyroxine (SYNTHROID, LEVOTHROID) 50 MCG tablet Take 50 mcg by mouth daily.    . Melatonin 5 MG CAPS Take 1 capsule by mouth at bedtime.    . Nutritional Supplements (DHEA) 15-50 MG CAPS Take 1 capsule by mouth.     . progesterone (PROMETRIUM) 200 MG capsule Take 200 mg by mouth daily.    . propranolol (INDERAL) 20 MG tablet Take 1 tablet (20 mg total) by mouth 2 (two) times daily. 180 tablet 3  . ranitidine (ZANTAC) 150 MG tablet Take 300 mg by mouth daily.    . sodium chloride (OCEAN) 0.65 % SOLN nasal spray Place 2 sprays into both nostrils as needed for  congestion.     No current facility-administered medications for this visit.     Past Medical History  Diagnosis Date  . Hyperlipidemia   . Endometriosis   . H/O: hysterectomy   . Stress incontinence, female   . Atrial ectopy   . Raynaud's disease   . Mitral regurgitation     Past Surgical History  Procedure Laterality Date  . Incontinence surgery    . Breast enhancement surgery    . Abdominal exploration surgery    . Appendectomy      Social History   Social History  . Marital Status: Single    Spouse Name: N/A  . Number of Children: N/A  . Years of Education: N/A   Occupational History  . Not on file.   Social History Main Topics  . Smoking status: Never Smoker   . Smokeless tobacco: Not on file  . Alcohol Use: Not on file  . Drug Use: Not on file  . Sexual Activity: Not on file   Other Topics Concern  . Not on file   Social History Narrative    Family History  Problem Relation Age of Onset  . Atrial fibrillation Mother   . Breast cancer Mother   . Cancer Father     ROS: no fevers or chills, productive cough, hemoptysis, dysphasia, odynophagia, melena, hematochezia, dysuria, hematuria, rash, seizure activity, orthopnea, PND, pedal edema, claudication. Remaining systems are negative.  Physical Exam: Well-developed well-nourished in no  acute distress.  Skin is warm and dry.  HEENT is normal.  Neck is supple.  Chest is clear to auscultation with normal expansion.  Cardiovascular exam is regular rate and rhythm. 2/6 systolic murmur apex. Abdominal exam nontender or distended. No masses palpated. Extremities show no edema. neuro grossly intact  ECG 10/10/2015-sinus rhythm, cannot rule out prior inferior infarct.

## 2015-11-23 ENCOUNTER — Ambulatory Visit (INDEPENDENT_AMBULATORY_CARE_PROVIDER_SITE_OTHER): Payer: BLUE CROSS/BLUE SHIELD | Admitting: Cardiology

## 2015-11-23 ENCOUNTER — Encounter: Payer: Self-pay | Admitting: Cardiology

## 2015-11-23 VITALS — BP 118/78 | HR 88 | Ht 69.0 in | Wt 158.0 lb

## 2015-11-23 DIAGNOSIS — I059 Rheumatic mitral valve disease, unspecified: Secondary | ICD-10-CM

## 2015-11-23 DIAGNOSIS — I480 Paroxysmal atrial fibrillation: Secondary | ICD-10-CM | POA: Diagnosis not present

## 2015-11-23 DIAGNOSIS — R0600 Dyspnea, unspecified: Secondary | ICD-10-CM | POA: Insufficient documentation

## 2015-11-23 DIAGNOSIS — R002 Palpitations: Secondary | ICD-10-CM | POA: Diagnosis not present

## 2015-11-23 NOTE — Assessment & Plan Note (Signed)
Continue beta blocker. 

## 2015-11-23 NOTE — Assessment & Plan Note (Signed)
Patient remains in sinus rhythm. Continue beta blocker and flecainide. Embolic risk factor includes female sex. She also states her blood pressure has been high in the past but not clear that hypertension has been diagnosed. She is on propranolol for her atrial fibrillation and amlodipine for Raynaud's. If she indeed has hypertension then she would need to be anticoagulated long-term. I will have Dr. Graciela HusbandsKlein reviewed this.

## 2015-11-23 NOTE — Assessment & Plan Note (Signed)
Patient has some dyspnea with vigorous activities. We will check echocardiogram to assess LV function and severity of MR.

## 2015-11-23 NOTE — Assessment & Plan Note (Signed)
Patient has a history of mitral valve prolapse with last echocardiogram demonstrating mild to moderate mitral regurgitation. I'm not convinced her dyspnea is secondary to MR. We will plan to repeat echocardiogram to reassess severity of MR. If there is any question about severely we could pursue TEE.

## 2015-11-23 NOTE — Patient Instructions (Signed)
Your physician has requested that you have an echocardiogram. Echocardiography is a painless test that uses sound waves to create images of your heart. It provides your doctor with information about the size and shape of your heart and how well your heart's chambers and valves are working. This procedure takes approximately one hour. There are no restrictions for this procedure.   Your physician wants you to follow-up in: ONE YEAR WITH DR CRENSHAW You will receive a reminder letter in the mail two months in advance. If you don't receive a letter, please call our office to schedule the follow-up appointment.   If you need a refill on your cardiac medications before your next appointment, please call your pharmacy.  

## 2015-12-08 ENCOUNTER — Telehealth: Payer: Self-pay | Admitting: Internal Medicine

## 2015-12-08 NOTE — Telephone Encounter (Signed)
New message      Pt c/o medication issue:  1. Name of Medication: amlodipine 2. How are you currently taking this medication (dosage and times per day)? 10mg  daily 3. Are you having a reaction (difficulty breathing--STAT)? no  4. What is your medication issue?  After being on medication approx 2wks, pt noticed her lower legs get red, hot and swollen after being on them for 2 hrs.  Is this a side effect? Is there something that can be done about this?  What happens if she is on her feet longer than 2 hrs?

## 2015-12-08 NOTE — Telephone Encounter (Signed)
Will forward to MD to review 

## 2015-12-08 NOTE — Telephone Encounter (Signed)
Never seen this before. We can stop you and try nifedipine 90 mg daily;  is a close cousin

## 2015-12-09 MED ORDER — NIFEDIPINE ER OSMOTIC RELEASE 90 MG PO TB24: 90.0000 mg | ORAL_TABLET | Freq: Every day | ORAL | Status: AC

## 2015-12-09 NOTE — Telephone Encounter (Signed)
LMTCB

## 2015-12-09 NOTE — Telephone Encounter (Signed)
PT AWARE OF  MED  CHANGE  NEW  SCRIPT SENT  VIA  EPIC .[name]/CY

## 2015-12-09 NOTE — Telephone Encounter (Signed)
[name] is returning your call . She says you can Lmsg if you are unable to reach her when calling back   Thanks

## 2015-12-21 ENCOUNTER — Ambulatory Visit (HOSPITAL_BASED_OUTPATIENT_CLINIC_OR_DEPARTMENT_OTHER): Payer: BLUE CROSS/BLUE SHIELD

## 2016-01-25 ENCOUNTER — Telehealth: Payer: Self-pay

## 2016-01-25 NOTE — Telephone Encounter (Signed)
Called patient and she stated she just picked up her Flecainide 100 MG last week and does not currently need a refill. She stated she will call and let us know when she needs a refill.

## 2016-01-25 NOTE — Telephone Encounter (Signed)
Message from Joanette Gulaandice A Price, CMA sent at 01/25/2016 10:46 AM EDT -----    Patient wanted to let Dr. Jens Somrenshaw know she did not get the Echo he wanted her to get in May 2017 due to cost.    noted

## 2016-04-17 ENCOUNTER — Other Ambulatory Visit: Payer: Self-pay | Admitting: Gastroenterology

## 2016-04-17 DIAGNOSIS — R1013 Epigastric pain: Secondary | ICD-10-CM

## 2016-04-17 DIAGNOSIS — R110 Nausea: Secondary | ICD-10-CM

## 2016-04-19 ENCOUNTER — Encounter: Payer: Self-pay | Admitting: Endocrinology

## 2016-04-19 ENCOUNTER — Ambulatory Visit (INDEPENDENT_AMBULATORY_CARE_PROVIDER_SITE_OTHER): Payer: BLUE CROSS/BLUE SHIELD | Admitting: Endocrinology

## 2016-04-19 VITALS — BP 110/70 | HR 72 | Temp 98.8°F | Ht 68.5 in | Wt 170.0 lb

## 2016-04-19 DIAGNOSIS — R635 Abnormal weight gain: Secondary | ICD-10-CM

## 2016-04-19 DIAGNOSIS — R5383 Other fatigue: Secondary | ICD-10-CM | POA: Diagnosis not present

## 2016-04-19 DIAGNOSIS — E039 Hypothyroidism, unspecified: Secondary | ICD-10-CM | POA: Diagnosis not present

## 2016-04-19 NOTE — Progress Notes (Signed)
Patient ID: [name], female   DOB: [birth year], 50 y.o.   MRN: [id number]             Reason for Appointment:  Hypothyroidism, new visit  Referring physician: Huel Cote    History of Present Illness:   Hypothyroidism was first diagnosed in 2011   At the time of diagnosis patient was not having symptoms of  fatigue, cold sensitivity, difficulty concentrating, dry skin, weight gain or hair loss She was told on a routine physical exam that her thyroid level was low and she needed to be on thyroid supplements She did not feel any different with starting the thyroid supplement She thinks she has been on the same dose since then .           The patient has been followed by various physicians over the last few years She thinks that over the last 2 years she has had more fatigue even on waking up in the morning She is also concerned about progressive weight gain of about 25 pounds over the last 2 years She thinks her nails do not grow and relatively thin No cold intolerance, currently still having hot flashes         Patient's weight history is as follows:  Wt Readings from Last 3 Encounters:  04/19/16 170 lb (77.1 kg)  11/23/15 158 lb (71.7 kg)  10/10/15 166 lb (75.3 kg)    Thyroid function results have been as follows:   2013: TSH 1.3; T3 3.4 2016: TSH 0.99  01/2016: TSH from gynecologist was 0.339  No results found for: FREET4, TSH   Past Medical History:  Diagnosis Date  . Atrial ectopy   . Endometriosis   . H/O: hysterectomy   . Hyperlipidemia   . Mitral regurgitation   . Raynaud's disease   . Stress incontinence, female     Past Surgical History:  Procedure Laterality Date  . ABDOMINAL EXPLORATION SURGERY    . APPENDECTOMY    . BREAST ENHANCEMENT SURGERY    . INCONTINENCE SURGERY      Family History  Problem Relation Age of Onset  . Atrial fibrillation Mother   . Breast cancer Mother   . Cancer Father   . Sleep apnea Father   . Thyroid  disease Maternal Aunt     Social History:  reports that she has never smoked. She does not have any smokeless tobacco history on file. Her alcohol and drug histories are not on file.  Allergies:  Allergies  Allergen Reactions  . Other Anaphylaxis    Steroid (unknown) that is used for back injections       Medication List       Accurate as of 04/19/16  8:41 PM. Always use your most recent med list.          cetirizine 10 MG tablet Commonly known as:  ZYRTEC Take 10 mg by mouth daily.   DHEA 15-50 MG Caps Take 1 capsule by mouth.   estradiol 1 MG tablet Commonly known as:  ESTRACE Take 1 mg by mouth daily.   etodolac 400 MG tablet Commonly known as:  LODINE Take 400 mg by mouth 2 (two) times daily.   flecainide 100 MG tablet Commonly known as:  TAMBOCOR TAKE 1 TABLET TWICE DAILY   levothyroxine 50 MCG tablet Commonly known as:  SYNTHROID, LEVOTHROID Take 50 mcg by mouth daily.   Melatonin 5 MG Caps Take 1 capsule by mouth at bedtime.   NIFEdipine 90  MG 24 hr tablet Commonly known as:  PROCARDIA XL/ADALAT-CC Take 1 tablet (90 mg total) by mouth daily.   progesterone 200 MG capsule Commonly known as:  PROMETRIUM Take 200 mg by mouth daily.   propranolol 20 MG tablet Commonly known as:  INDERAL Take 1 tablet (20 mg total) by mouth 2 (two) times daily.   ranitidine 150 MG tablet Commonly known as:  ZANTAC Take 300 mg by mouth daily.   sodium chloride 0.65 % Soln nasal spray Commonly known as:  OCEAN Place 2 sprays into both nostrils as needed for congestion.        Review of Systems  Constitutional: Positive for weight gain.  HENT: Negative for trouble swallowing.   Respiratory: Positive for daytime sleepiness.        She has history of snoring and her husband has observed interruption of her breathing at night  Cardiovascular: Positive for palpitations.       Palpitations controlled if she is taking propranolol regularly, history of atrial  fibrillation  Endocrine:       Hot flashes for 5 years She applies testosterone cream for decreased libido that is chronic.  Testosterone level high done by gynecologist.  Also takes estradiol, progesterone and DHEA on her own  Musculoskeletal: Negative for joint pain.  Psychiatric/Behavioral: Positive for insomnia.       Takes melatonin with improvement in symptoms               Examination:    BP 110/70 (BP Location: Left Arm, Patient Position: Sitting, Cuff Size: Normal)   Pulse 72   Temp 98.8 F (37.1 C) (Oral)   Ht 5' 8.5" (1.74 m)   Wt 170 lb (77.1 kg)   BMI 25.47 kg/m   GENERAL:  Average build.   No pallor, lymphadenopathy or edema.  Skin:  no rash or pigmentation.  EYES:  No prominence of the eyes or swelling of the eyelids  ENT: Oral mucosa  normal.  THYROID:  Not palpable.  HEART:  Normal  S1 and S2; no murmur or click.  CHEST:    Lungs: Vescicular breath sounds heard equally.  No crepitations/ wheeze.  ABDOMEN:  No distention.  Liver and spleen not palpable.  No other mass or tenderness.  NEUROLOGICAL: Reflexes are bilaterally normal at biceps.  JOINTS:  Normal.   Assessment:  ?  HYPOTHYROIDISM.  Information from the time of her diagnosis is not available She may have had subclinical hypothyroidism initially as she did not subjectively feel any different with thyroid supplementation She has been on the same dose of 50 g for at least 6-7 years  More recently she is complaining about weight gain and fatigue but no other typical symptoms of hypothyroidism She does not work goiter on exam and looks euthyroid  Her last TSH has been low normal at about 0.34 with her taking the same dose of 50 g Explained to her that it is unlikely that her symptoms are related to hypothyroidism since TSH is low normal and she had been doing reasonably well for several years with levothyroxine  MENOPAUSE: Been treated for the last few years by gynecologist and is taking a  cocktail of estradiol, progesterone and DHEA along with testosterone cream.  Recently testosterone level is only high, but will defer management to gynecologist with been following the patient for some time  PROBABLE SLEEP apnea: Currently not evaluated.  She does not understand the importance of evaluation and treatment  PLAN:   Reduce  levothyroxine to 25 g or half tablet Follow-up in 6 weeks for repeat thyroid levels  Recommended that she establish with a new physician when she moves and do a sleep study as she likely has symptomatic sleep apnea as the cause of her fatigue. Also recommended starting an exercise program for weight loss   Crystalina Stodghill 04/19/2016, 8:41 PM

## 2016-04-19 NOTE — Patient Instructions (Signed)
Reduce thyroid to 1/2 daily

## 2016-05-01 ENCOUNTER — Ambulatory Visit (HOSPITAL_COMMUNITY)
Admission: RE | Admit: 2016-05-01 | Discharge: 2016-05-01 | Disposition: A | Discharge status: Home / Self Care | Payer: BLUE CROSS/BLUE SHIELD | Source: Ambulatory Visit | Attending: Gastroenterology | Admitting: Gastroenterology

## 2016-05-01 ENCOUNTER — Encounter (HOSPITAL_COMMUNITY)
Admission: RE | Admit: 2016-05-01 | Discharge: 2016-05-01 | Disposition: A | Discharge status: Home / Self Care | Payer: BLUE CROSS/BLUE SHIELD | Source: Ambulatory Visit | Attending: Gastroenterology | Admitting: Gastroenterology

## 2016-05-01 DIAGNOSIS — R935 Abnormal findings on diagnostic imaging of other abdominal regions, including retroperitoneum: Secondary | ICD-10-CM | POA: Diagnosis not present

## 2016-05-01 DIAGNOSIS — R1013 Epigastric pain: Secondary | ICD-10-CM

## 2016-05-01 DIAGNOSIS — R110 Nausea: Secondary | ICD-10-CM | POA: Insufficient documentation

## 2016-05-02 ENCOUNTER — Ambulatory Visit (HOSPITAL_BASED_OUTPATIENT_CLINIC_OR_DEPARTMENT_OTHER)
Admission: RE | Admit: 2016-05-02 | Discharge: 2016-05-02 | Disposition: A | Discharge status: Home / Self Care | Payer: BLUE CROSS/BLUE SHIELD | Source: Ambulatory Visit | Attending: Cardiology | Admitting: Cardiology

## 2016-05-02 DIAGNOSIS — I4891 Unspecified atrial fibrillation: Secondary | ICD-10-CM | POA: Diagnosis not present

## 2016-05-02 DIAGNOSIS — I341 Nonrheumatic mitral (valve) prolapse: Secondary | ICD-10-CM | POA: Insufficient documentation

## 2016-05-02 DIAGNOSIS — I340 Nonrheumatic mitral (valve) insufficiency: Secondary | ICD-10-CM | POA: Diagnosis not present

## 2016-05-02 DIAGNOSIS — I119 Hypertensive heart disease without heart failure: Secondary | ICD-10-CM | POA: Diagnosis not present

## 2016-05-02 DIAGNOSIS — I059 Rheumatic mitral valve disease, unspecified: Secondary | ICD-10-CM | POA: Diagnosis not present

## 2016-05-02 NOTE — Progress Notes (Signed)
  Echocardiogram 2D Echocardiogram has been performed.  Nolon RodBrown, Tony 05/02/2016, 9:19 AM

## 2016-05-15 ENCOUNTER — Other Ambulatory Visit: Payer: Self-pay | Admitting: Surgery

## 2016-05-21 ENCOUNTER — Telehealth: Payer: Self-pay | Admitting: Internal Medicine

## 2016-05-21 NOTE — Telephone Encounter (Signed)
Informed patient that I did not see any clearance request in Dr. Odessa FlemingKlein's folder.  She is aware I will forward to nurse to address when she is back in the office later this week.  Patient is aware Dr. Graciela HusbandsKlein is not in the office this week to sign any clearance. She will call Dr. Doroteo GlassmanBlackmon's office to check on status of request.   She understands Heather, RN will follow up with her later this week.

## 2016-05-21 NOTE — Telephone Encounter (Signed)
Follow Up:    Pt wants to know if u have received a surgical clearance from Dr Doroteo GlassmanBlackmon's office?

## 2016-05-23 NOTE — Telephone Encounter (Signed)
Form received on 9/29. I sent a message to [facility] to please call and arrange an appointment for clearance with the PA/ NP. Reviewed her appointments this morning and she is scheduled to see Ronie Spiesayna Dunn, PA on 05/24/16.

## 2016-05-24 ENCOUNTER — Ambulatory Visit (INDEPENDENT_AMBULATORY_CARE_PROVIDER_SITE_OTHER): Payer: BLUE CROSS/BLUE SHIELD | Admitting: Physician Assistant

## 2016-05-24 ENCOUNTER — Encounter: Payer: Self-pay | Admitting: Physician Assistant

## 2016-05-24 ENCOUNTER — Encounter (INDEPENDENT_AMBULATORY_CARE_PROVIDER_SITE_OTHER): Payer: Self-pay

## 2016-05-24 VITALS — BP 124/72 | HR 90 | Ht 69.0 in | Wt 168.0 lb

## 2016-05-24 DIAGNOSIS — Z01810 Encounter for preprocedural cardiovascular examination: Secondary | ICD-10-CM

## 2016-05-24 DIAGNOSIS — I471 Supraventricular tachycardia: Secondary | ICD-10-CM

## 2016-05-24 DIAGNOSIS — I493 Ventricular premature depolarization: Secondary | ICD-10-CM

## 2016-05-24 DIAGNOSIS — I480 Paroxysmal atrial fibrillation: Secondary | ICD-10-CM

## 2016-05-24 DIAGNOSIS — R0602 Shortness of breath: Secondary | ICD-10-CM

## 2016-05-24 DIAGNOSIS — E785 Hyperlipidemia, unspecified: Secondary | ICD-10-CM

## 2016-05-24 DIAGNOSIS — I472 Ventricular tachycardia: Secondary | ICD-10-CM

## 2016-05-24 DIAGNOSIS — I4729 Other ventricular tachycardia: Secondary | ICD-10-CM

## 2016-05-24 DIAGNOSIS — I4719 Other supraventricular tachycardia: Secondary | ICD-10-CM

## 2016-05-24 DIAGNOSIS — R079 Chest pain, unspecified: Secondary | ICD-10-CM

## 2016-05-24 NOTE — Patient Instructions (Addendum)
Medication Instructions:  Your physician recommends that you continue on your current medications as directed. Please refer to the Current Medication list given to you today.   Labwork: -None  Testing/Procedures: Your physician has requested that you have a lexiscan myoview. For further information please visit [email or web address]. Please follow instruction sheet, as given.     Follow-Up:Your physician wants you to follow-up with Dr. Jens Somrenshaw and Dr. Graciela HusbandsKlein.  You will receive a reminder letter in the mail two months in advance. If you don't receive a letter, please call our office to schedule the follow-up appointment.     Any Other Special Instructions Will Be Listed Below (If Applicable).     If you need a refill on your cardiac medications before your next appointment, please call your pharmacy.

## 2016-05-24 NOTE — Progress Notes (Signed)
Cardiology Office Note    Date:  05/24/2016  ID:  TUWANDA VOKES, DOB 1966-07-30, MRN [id number] PCP:  No PCP Per Patient  Cardiologist:  Jens Som; EP: Graciela Husbands   Chief Complaint: pre-op eval, SOB  History of Present Illness:  [name] is a 50 y.o. female with history of paroxysmal atrial fib, atrial tach, PVCs, NSVT ("wide complex bigeminy in the context of sinus rhythm and nonsustained ventricular tachycardia" per Dr. Koren Bound' note), mild mitral regurgitation, hypothyroidism, hyperlipidemia, Raynaud's disease whom we are asked to see for cardiac clearance. Prior studies include Cardiac MRI July 2007: normal RV size and function with no evidence of fatty infiltration, ejection fraction 53% with no scar or enhancement. Stress echocardiogram December 2015 showed mild to moderate mitral regurgitation at rest with no change in severity with exercise (study challenging due to breast implants and limited echocardiographic windows). 2D Echo 05/02/16 showed EF 55%, normal diastolic parameters, mild bileaflet mitral valve prolapse with mild MR, mild LAE, normal GLS. She previously discontinued propranolol due to Raynaud's and digital ulcers on her feet but more recently has been able to tolerate this with control of her palpitations. CHADSVASC has been 1 for female. She recently saw Dr. Jens Som for DOE with vigorous activity, prompting echo as above. MR was mild; no further workup ordered. Last labs: normal thyroid (03/2016), Na 138, K 4.2, CO2 21, BUN 20, Cr 0.90, albumin 4.8, AST 28, ALT 16, Hgb 12.9, Hct 37.8, plt 260, LDL 61 (01/2016).  She presents back to clinic today for pre-op evaluation. Has to have gallbladder out. Wanting this done ASAP as she is closing on her house in 2 weeks and moving to [place]. Does report exertional dyspnea for the past year with vigorous activities. Also noted today she felt somewhat SOB after eating a large meal with increased ectopy. She continues to have occasional  palpitations but in general states they are well controlled on propranolol. EKG shows inferolateral TWI and occ PVCs.  Past Medical History:  Diagnosis Date  . Atrial ectopy   . Atrial tachycardia (HCC)   . Endometriosis   . H/O: hysterectomy   . Hyperlipidemia   . Hypothyroidism   . Mitral regurgitation   . MVP (mitral valve prolapse)   . NSVT (nonsustained ventricular tachycardia) (HCC)   . Paroxysmal atrial fibrillation (HCC)   . PVC's (premature ventricular contractions)   . Raynaud's disease   . Stress incontinence, female     Past Surgical History:  Procedure Laterality Date  . ABDOMINAL EXPLORATION SURGERY    . APPENDECTOMY    . BREAST ENHANCEMENT SURGERY    . INCONTINENCE SURGERY      Current Medications: Current Outpatient Prescriptions  Medication Sig Dispense Refill  . estradiol (ESTRACE) 1 MG tablet Take 1 mg by mouth daily.    . flecainide (TAMBOCOR) 100 MG tablet TAKE 1 TABLET TWICE DAILY 60 tablet 10  . levothyroxine (SYNTHROID, LEVOTHROID) 50 MCG tablet Take 50 mcg by mouth daily.    . Melatonin 5 MG CAPS Take 1 capsule by mouth at bedtime.    Marland Kitchen NIFEdipine (PROCARDIA XL/ADALAT-CC) 90 MG 24 hr tablet Take 1 tablet (90 mg total) by mouth daily. 30 tablet 11  . Nutritional Supplements (DHEA) 15-50 MG CAPS Take 1 capsule by mouth.     . progesterone (PROMETRIUM) 200 MG capsule Take 200 mg by mouth daily.    . propranolol (INDERAL) 20 MG tablet Take 1 tablet (20 mg total) by mouth 2 (two) times daily.  180 tablet 3  . ranitidine (ZANTAC) 150 MG tablet Take 300 mg by mouth daily.     No current facility-administered medications for this visit.      Allergies:   Other   Social History   Social History  . Marital status: Married    Spouse name: N/A  . Number of children: N/A  . Years of education: N/A   Social History Main Topics  . Smoking status: Never Smoker  . Smokeless tobacco: None  . Alcohol use None  . Drug use: Unknown  . Sexual activity: Not  Asked   Other Topics Concern  . None   Social History Narrative  . None     Family History:  The patient's family history includes Atrial fibrillation in her mother; Breast cancer in her mother; Cancer in her father; Sleep apnea in her father; Thyroid disease in her maternal aunt.   ROS:   Please see the history of present illness.  All other systems are reviewed and otherwise negative.    PHYSICAL EXAM:   VS:  BP 124/72   Pulse 90   Ht 5\' 9"  (1.753 m)   Wt 168 lb (76.2 kg)   BMI 24.81 kg/m   BMI: Body mass index is 24.81 kg/m. GEN: Well nourished, well developed thin WF, in no acute distress  HEENT: normocephalic, atraumatic Neck: no JVD, carotid bruits, or masses Cardiac: RRR occ ectopy; no murmurs, rubs, or gallops, no edema  Respiratory:  clear to auscultation bilaterally, normal work of breathing GI: soft, nontender, nondistended, + BS MS: no deformity or atrophy  Skin: warm and dry, no rash Neuro:  Alert and Oriented x 3, Strength and sensation are intact, follows commands Psych: euthymic mood, full affect  Wt Readings from Last 3 Encounters:  05/24/16 168 lb (76.2 kg)  04/19/16 170 lb (77.1 kg)  11/23/15 158 lb (71.7 kg)      Studies/Labs Reviewed:   EKG:  EKG was ordered today and personally reviewed by me and demonstrates NSR 90bpm, occ PVCs, occ PACs, nonspecific ST-T changes - TWI inferiorly as well as V4-V6 new from 09/2015  Recent Labs: No results found for requested labs within last 8760 hours.   Lipid Panel No results found for: CHOL, TRIG, HDL, CHOLHDL, VLDL, LDLCALC, LDLDIRECT  Additional studies/ records that were reviewed today include: Summarized above.    ASSESSMENT & PLAN:   1. Pre-op CV exam - she reports DOE with heavy exertion x 1 year. EKG shows inferior TWI as well as V4-V6 that looks new from tracing in 09/2015. Recent echo with normal LV function. Given upcoming surgery, dyspnea, and flecainide use I feel it's prudent to proceed  with nuclear stress test for cardiac clearance. Reviewed with Dr. Jens Somrenshaw. Will attempt exercise nuc and have her hold propranolol AM of test. If she does not reach target HR will need to change to Lexiscan. She initially requested this be done ASAP but stated next Thursday actually works better for her schedule.  2. Paroxysmal atrial fib - maintaining NSR. CHADSVASC 1 only for female. Observe for clinical recurrence. 3. Other arrhythmias to include PVCs, NSVT, atrial tach - freq PVCs noted by EKG today. Continue current regimen. She is cognizant of these extra beats but says she has generally felt they are controlled on present regimen. She felt increased ectopy after eating today. Asked her to let us know if her ectopy increases in frequency to determine if further med adjustment is needed. 4. Hyperlipidemia - LDL  controlled with recent labs.  Disposition: F/u with Dr. Winfield Rast. Graciela Husbands as previously recommended.   Medication Adjustments/Labs and Tests Ordered: Current medicines are reviewed at length with the patient today.  Concerns regarding medicines are outlined above. Medication changes, Labs and Tests ordered today are summarized above and listed in the Patient Instructions accessible in Encounters.   Caroline Mohair PA-C  05/24/2016 3:42 PM    [facility] [address] [place], [place], [place]  [address] Phone: [phone]; Fax: [phone]

## 2016-05-25 ENCOUNTER — Encounter (HOSPITAL_COMMUNITY): Payer: BLUE CROSS/BLUE SHIELD

## 2016-05-29 ENCOUNTER — Telehealth (HOSPITAL_COMMUNITY): Payer: Self-pay | Admitting: *Deleted

## 2016-05-29 NOTE — Telephone Encounter (Signed)
Patient given detailed instructions per Myocardial Perfusion Study Information Sheet for the test on 05/31/16. Patient notified to arrive 15 minutes early and that it is imperative to arrive on time for appointment to keep from having the test rescheduled.  If you need to cancel or reschedule your appointment, please call the office within 24 hours of your appointment. Failure to do so may result in a cancellation of your appointment, and a $50 no show fee. Patient verbalized understanding. Caylea Foronda Jacqueline    

## 2016-05-30 ENCOUNTER — Other Ambulatory Visit: Payer: Self-pay | Admitting: *Deleted

## 2016-05-30 ENCOUNTER — Telehealth: Payer: Self-pay | Admitting: Physician Assistant

## 2016-05-30 ENCOUNTER — Telehealth (HOSPITAL_COMMUNITY): Payer: Self-pay

## 2016-05-30 ENCOUNTER — Other Ambulatory Visit: Payer: Self-pay | Admitting: Physician Assistant

## 2016-05-30 DIAGNOSIS — R0600 Dyspnea, unspecified: Secondary | ICD-10-CM

## 2016-05-30 NOTE — Telephone Encounter (Signed)
I was notified by Marilynne Halstedharmaine Hall, our pre-cert coordinator, that the patient's insurance would not approve for this patient to have a nuclear stress test without peer-to-peer. I spoke with the insurance company with peer-to-peer review with their company's MD this morning. I reiterated that this patient had had recent exertional SOB, new TWI on EKG, as well as frequent ectopy. I was informed that this does not meet criteria to proceed with a nuclear stress test (nor a stress echo). I was told she would have to have an updated Holter first to quantify the number of PVCs before a nuclear stress test could be considered for approval. I told the peer-to-peer MD that we did not need to repeat an unnecessary event monitor as it was not the information we were looking for (known history of PVCs) and that it's the stress test we need, particularly in light of the patient being on flecainide and pre-operative evaluation. Despite sharing all pertinent clinical information, I was told they would only approve a GXT which does not seem sufficient given the abnormal baseline EKG. I was told that a nuclear stress test study could not be approved.  Charmaine, can you look into whether her insurance would approve a coronary CT to further evaluate? Please let the patient know her insurance will not approve the study despite this being the most appropriate test to clear her at this time.  Will also forward to Dr. Jens Somrenshaw to get his thoughts.  Corran Lalone PA-C

## 2016-05-30 NOTE — Telephone Encounter (Signed)
If nuclear study cannot be approved or CTA, would arrange ETT; if indeterminant would then need nuclear study Olga MillersBrian Crenshaw

## 2016-05-30 NOTE — Telephone Encounter (Signed)
Prior Auth for Nuc 05-31-16 denied by insurance. Pt to do GXT@NL  05-31-16 w/pt arriving at 12.  Pt also scheduled to do MPI 06-01-16@NL  w/pt arriving@12 :30.  Pt understands she will take her last pm Propanolol tonight and not take tomorrow morning's dose.  She will need to get further instructions on this Rx tomorrow after GXT for her nuc on Fri.  Pt to have light breakfast 05-31-16.  Pt agrees to have no caffeine, whether food or drink, i.e., no coffee, tea, chocolate.  Advised to wear comfortable walking shoes and loose clothing to walk on treadmill

## 2016-05-30 NOTE — Telephone Encounter (Signed)
Encounter complete. 

## 2016-05-31 ENCOUNTER — Telehealth: Payer: Self-pay | Admitting: Cardiology

## 2016-05-31 ENCOUNTER — Encounter (HOSPITAL_COMMUNITY): Payer: BLUE CROSS/BLUE SHIELD

## 2016-05-31 ENCOUNTER — Ambulatory Visit (HOSPITAL_COMMUNITY)
Admission: RE | Admit: 2016-05-31 | Discharge: 2016-05-31 | Disposition: A | Discharge status: Home / Self Care | Payer: BLUE CROSS/BLUE SHIELD | Source: Ambulatory Visit | Attending: Cardiovascular Disease | Admitting: Cardiovascular Disease

## 2016-05-31 DIAGNOSIS — R0600 Dyspnea, unspecified: Secondary | ICD-10-CM

## 2016-05-31 DIAGNOSIS — I493 Ventricular premature depolarization: Secondary | ICD-10-CM | POA: Insufficient documentation

## 2016-05-31 LAB — EXERCISE TOLERANCE TEST
CHL CUP MPHR: 168 {beats}/min
CHL CUP RESTING HR STRESS: 72 {beats}/min
CSEPHR: 86 %
CSEPPHR: 146 {beats}/min
Estimated workload: 10.1 METS
Exercise duration (min): 8 min
Exercise duration (sec): 59 s
RPE: 18

## 2016-05-31 NOTE — Telephone Encounter (Signed)
New Message  Pt voiced having questions about the procedure she's having today and would like for nurse to contact her back.  Please f/u

## 2016-06-01 ENCOUNTER — Ambulatory Visit (HOSPITAL_COMMUNITY)
Admission: RE | Admit: 2016-06-01 | Discharge: 2016-06-01 | Disposition: A | Discharge status: Home / Self Care | Payer: BLUE CROSS/BLUE SHIELD | Source: Ambulatory Visit | Attending: Cardiology | Admitting: Cardiology

## 2016-06-01 ENCOUNTER — Other Ambulatory Visit: Payer: BLUE CROSS/BLUE SHIELD

## 2016-06-01 DIAGNOSIS — R9439 Abnormal result of other cardiovascular function study: Secondary | ICD-10-CM | POA: Diagnosis not present

## 2016-06-01 DIAGNOSIS — R079 Chest pain, unspecified: Secondary | ICD-10-CM | POA: Diagnosis not present

## 2016-06-01 DIAGNOSIS — Z01810 Encounter for preprocedural cardiovascular examination: Secondary | ICD-10-CM | POA: Insufficient documentation

## 2016-06-01 DIAGNOSIS — I480 Paroxysmal atrial fibrillation: Secondary | ICD-10-CM | POA: Insufficient documentation

## 2016-06-01 LAB — MYOCARDIAL PERFUSION IMAGING
CHL CUP RESTING HR STRESS: 67 {beats}/min
CSEPPHR: 97 {beats}/min
LVDIAVOL: 117 mL (ref 46–106)
LVSYSVOL: 42 mL
SDS: 2
SRS: 9
SSS: 11
TID: 0.97

## 2016-06-01 IMAGING — NM NM MISC PROCEDURE
6 series · 36 of 36 positions shown · non-contrast
Comparison: none

[Series 1: wbr rest · 6.40mm/px · 6 of 62 frames shown]
[frame 6/62]
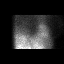
[frame 16/62]
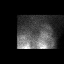
[frame 26/62]
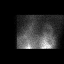
[frame 37/62]
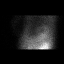
[frame 47/62]
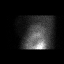
[frame 57/62]
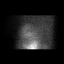

[Series 1: wbr_r-proj_st wbr rest · 6.40mm/px · 6 of 64 frames shown]
[frame 6/64]
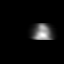
[frame 16/64]
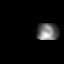
[frame 27/64]
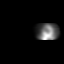
[frame 38/64]
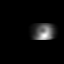
[frame 48/64]
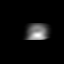
[frame 59/64]
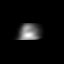

[Series 2: wbr stress-gsp · 6.40mm/px · 6 of 512 frames shown]
[frame 43/512]
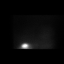
[frame 128/512]
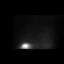
[frame 214/512]
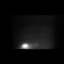
[frame 299/512]
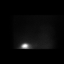
[frame 384/512]
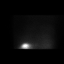
[frame 470/512]
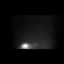

[Series 2: wbr_s-proj_st wbr stress-gsp · 6.40mm/px · 6 of 512 frames shown]
[frame 43/512]
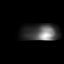
[frame 128/512]
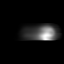
[frame 214/512]
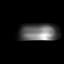
[frame 299/512]
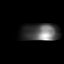
[frame 384/512]
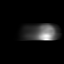
[frame 470/512]
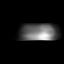

[Series 3: wbr_s-proj_st wbr stress-sum-em · 6.40mm/px · 6 of 64 frames shown]
[frame 6/64]
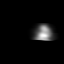
[frame 16/64]
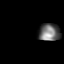
[frame 27/64]
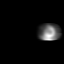
[frame 38/64]
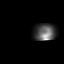
[frame 48/64]
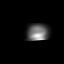
[frame 59/64]
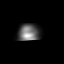

[Series 3: wbr stress-sum-em · 6.40mm/px · 6 of 64 frames shown]
[frame 6/64]
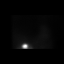
[frame 16/64]
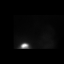
[frame 27/64]
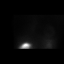
[frame 38/64]
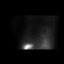
[frame 48/64]
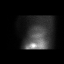
[frame 59/64]
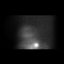

[36 of 36 positions shown; findings below may reference images not displayed]

Canned report from images found in remote index.

Refer to host system for actual result text.

## 2016-06-01 MED ORDER — REGADENOSON 0.4 MG/5ML IV SOLN
0.4000 mg | Freq: Once | INTRAVENOUS | Status: AC
  Administered 2016-06-01: 0.4 mg via INTRAVENOUS

## 2016-06-01 MED ORDER — TECHNETIUM TC 99M TETROFOSMIN IV KIT
31.0000 | PACK | Freq: Once | INTRAVENOUS | Status: AC | PRN
  Administered 2016-06-01: 31 via INTRAVENOUS
  Filled 2016-06-01: qty 31

## 2016-06-01 MED ORDER — TECHNETIUM TC 99M TETROFOSMIN IV KIT
10.6000 | PACK | Freq: Once | INTRAVENOUS | Status: AC | PRN
  Administered 2016-06-01: 10.6 via INTRAVENOUS
  Filled 2016-06-01: qty 11

## 2016-06-01 MED ORDER — AMINOPHYLLINE 25 MG/ML IV SOLN
75.0000 mg | Freq: Once | INTRAVENOUS | Status: AC
  Administered 2016-06-01: 75 mg via INTRAVENOUS

## 2016-06-06 ENCOUNTER — Ambulatory Visit: Payer: BLUE CROSS/BLUE SHIELD | Admitting: Endocrinology

## 2016-06-18 ENCOUNTER — Telehealth: Payer: Self-pay | Admitting: Internal Medicine

## 2016-06-18 NOTE — Telephone Encounter (Signed)
Mrs.Garfinkle is calling because she is having surgery in the morning and she is out of her NIfedipine and she tried to get it refilled [facility] . She wants to know if it is ok to not take it before the surgery and can you send a new prescription be sent to the [facility] on [address] [place] . Please call   Thanks

## 2016-06-18 NOTE — Telephone Encounter (Signed)
Per the pts request I have called in her Nifedipine 90 mg #30 with 11 refills to [facility] [place]. She verbalized understanding and thanked me for my help.

## 2016-06-19 ENCOUNTER — Other Ambulatory Visit: Payer: Self-pay | Admitting: Surgery

## 2016-09-07 ENCOUNTER — Other Ambulatory Visit: Payer: Self-pay | Admitting: *Deleted

## 2016-09-07 MED ORDER — FLECAINIDE ACETATE 100 MG PO TABS: 100.0000 mg | ORAL_TABLET | Freq: Two times a day (BID) | ORAL | 11 refills | Status: AC

## 2016-11-19 ENCOUNTER — Telehealth: Payer: Self-pay

## 2016-11-19 NOTE — Telephone Encounter (Signed)
-----   Message from Juleen Starr, LPN sent at 08/25/1094  2:11 PM EDT ----- Pt needs f/u appt with Graciela Husbands. Thanks!

## 2016-11-19 NOTE — Telephone Encounter (Signed)
L MOM to schedule f/u appt with Graciela Husbands for further refills.

## 2016-11-21 NOTE — Telephone Encounter (Signed)
l mom to schedule appt with Dr. Graciela Husbands

## 2016-12-05 ENCOUNTER — Telehealth: Payer: Self-pay | Admitting: Internal Medicine

## 2016-12-05 NOTE — Telephone Encounter (Signed)
Pt calling back stating she has moved and has a new cardiologist Just wanted to let us know.

## 2016-12-17 ENCOUNTER — Telehealth: Payer: Self-pay | Admitting: Internal Medicine

## 2016-12-17 NOTE — Telephone Encounter (Signed)
New message   Pt c/o medication issue:  1. Name of Medication: flecainide (TAMBOCOR) 100 MG tablet  2. How are you currently taking this medication (dosage and times per day)?   3. Are you having a reaction (difficulty breathing--STAT)? no  4. What is your medication issue? Dr. Fransico Setters (cardiologist) in [place] wants to take pt off of flecainide (TAMBOCOR) 100 MG tablet and Dr. Graciela Husbands put her on this med so she wants a second opinion on this before he stops this medication. Requests a call back.

## 2016-12-17 NOTE — Telephone Encounter (Signed)
I called and spoke with the patient. [name] states that her cardiologist in [place] has repeated her echo and her EF is down to 45-50%.  [name] also has some mild LV dilitation. [name] is scheduled to have an implantable loop recorder placed on 01/11/17. Per the patient, her cardiologist wants her to stop her flecainide and has talked about the possibility of putting her on Tikosyn/ possible DCCV. [name] is scheduled to go out of the country next week and was concerned about stopping the flecainide. I inquired if [name] has notified her current cardiologist of this and [name] stated [name] had not. I advised her it may be in her best interest to leave the flecainide alone until [name] is back in the country, but [name] should speak with her physician in [place] about this. [name] would still like Dr. Odessa Fleming opinion on stopping this medication.

## 2016-12-17 NOTE — Telephone Encounter (Signed)
I would agree, H, that stopping the flecainide prior to leaving the country isnt great idea.  Does she want [place] to weigh in on tikosyn?  If she is having a bunch of afib, either that or Ablation seems reasonable

## 2016-12-18 NOTE — Telephone Encounter (Signed)
I left a message of Dr. Klein's recommendations on the patient's identified voice mail.
